# Patient Record
Sex: Female | Born: 1960 | Race: White | Hispanic: No | Marital: Single | Smoking: Current some day smoker
Health system: Southern US, Community
[De-identification: ages and names within clinical notes are randomized; demographics above are authoritative.]

## PROBLEM LIST (undated history)

## (undated) DIAGNOSIS — J45909 Unspecified asthma, uncomplicated: Secondary | ICD-10-CM

## (undated) DIAGNOSIS — R011 Cardiac murmur, unspecified: Secondary | ICD-10-CM

## (undated) DIAGNOSIS — M1990 Unspecified osteoarthritis, unspecified site: Secondary | ICD-10-CM

## (undated) DIAGNOSIS — D509 Iron deficiency anemia, unspecified: Secondary | ICD-10-CM

## (undated) DIAGNOSIS — J189 Pneumonia, unspecified organism: Secondary | ICD-10-CM

## (undated) DIAGNOSIS — R51 Headache: Secondary | ICD-10-CM

## (undated) DIAGNOSIS — E079 Disorder of thyroid, unspecified: Secondary | ICD-10-CM

## (undated) DIAGNOSIS — E7800 Pure hypercholesterolemia, unspecified: Secondary | ICD-10-CM

## (undated) DIAGNOSIS — E119 Type 2 diabetes mellitus without complications: Secondary | ICD-10-CM

## (undated) DIAGNOSIS — C50919 Malignant neoplasm of unspecified site of unspecified female breast: Secondary | ICD-10-CM

## (undated) DIAGNOSIS — R519 Headache, unspecified: Secondary | ICD-10-CM

## (undated) DIAGNOSIS — K922 Gastrointestinal hemorrhage, unspecified: Secondary | ICD-10-CM

## (undated) DIAGNOSIS — E039 Hypothyroidism, unspecified: Secondary | ICD-10-CM

## (undated) HISTORY — DX: Pure hypercholesterolemia, unspecified: E78.00

## (undated) HISTORY — PX: UPPER GI ENDOSCOPY: SHX6162

## (undated) HISTORY — DX: Disorder of thyroid, unspecified: E07.9

## (undated) HISTORY — DX: Malignant neoplasm of unspecified site of unspecified female breast: C50.919

## (undated) HISTORY — DX: Type 2 diabetes mellitus without complications: E11.9

## (undated) HISTORY — DX: Unspecified osteoarthritis, unspecified site: M19.90

## (undated) HISTORY — PX: CARPAL TUNNEL RELEASE: SHX101

## (undated) HISTORY — PX: OOPHORECTOMY: SHX86

## (undated) HISTORY — DX: Iron deficiency anemia, unspecified: D50.9

## (undated) HISTORY — DX: Gastrointestinal hemorrhage, unspecified: K92.2

## (undated) HISTORY — PX: OTHER SURGICAL HISTORY: SHX169

## (undated) HISTORY — DX: Cardiac murmur, unspecified: R01.1

---

## 1981-10-18 HISTORY — PX: HEMORRHOID SURGERY: SHX153

## 1982-10-18 HISTORY — PX: TONSILLECTOMY: SUR1361

## 1989-10-18 DIAGNOSIS — E079 Disorder of thyroid, unspecified: Secondary | ICD-10-CM

## 1989-10-18 HISTORY — DX: Disorder of thyroid, unspecified: E07.9

## 1994-10-18 DIAGNOSIS — C50919 Malignant neoplasm of unspecified site of unspecified female breast: Secondary | ICD-10-CM

## 1994-10-18 HISTORY — PX: MASTECTOMY: SHX3

## 1994-10-18 HISTORY — DX: Malignant neoplasm of unspecified site of unspecified female breast: C50.919

## 1995-10-19 HISTORY — PX: ABDOMINAL HYSTERECTOMY: SHX81

## 2001-10-18 HISTORY — PX: BREAST BIOPSY: SHX20

## 2004-09-20 ENCOUNTER — Emergency Department: Payer: Self-pay | Admitting: Unknown Physician Specialty

## 2004-10-18 HISTORY — PX: COLONOSCOPY: SHX174

## 2005-04-02 ENCOUNTER — Ambulatory Visit: Payer: Self-pay | Admitting: General Surgery

## 2005-07-23 ENCOUNTER — Ambulatory Visit: Payer: Self-pay | Admitting: General Surgery

## 2005-10-27 ENCOUNTER — Emergency Department: Payer: Self-pay | Admitting: Emergency Medicine

## 2006-05-25 ENCOUNTER — Ambulatory Visit: Payer: Self-pay | Admitting: General Surgery

## 2006-06-15 ENCOUNTER — Ambulatory Visit: Payer: Self-pay | Admitting: General Surgery

## 2006-09-27 ENCOUNTER — Ambulatory Visit: Payer: Self-pay | Admitting: Specialist

## 2006-12-14 ENCOUNTER — Emergency Department: Payer: Self-pay | Admitting: Emergency Medicine

## 2007-07-05 ENCOUNTER — Ambulatory Visit: Payer: Self-pay | Admitting: General Surgery

## 2008-05-10 ENCOUNTER — Emergency Department: Payer: Self-pay | Admitting: Emergency Medicine

## 2008-05-20 ENCOUNTER — Ambulatory Visit: Payer: Self-pay | Admitting: Specialist

## 2008-07-29 ENCOUNTER — Ambulatory Visit: Payer: Self-pay | Admitting: General Surgery

## 2008-07-29 ENCOUNTER — Ambulatory Visit: Payer: Self-pay | Admitting: Specialist

## 2008-10-18 HISTORY — PX: GASTRIC BYPASS: SHX52

## 2008-10-24 ENCOUNTER — Ambulatory Visit: Payer: Self-pay | Admitting: Specialist

## 2008-10-24 ENCOUNTER — Ambulatory Visit: Payer: Self-pay | Admitting: Endocrinology

## 2008-11-18 ENCOUNTER — Ambulatory Visit: Payer: Self-pay | Admitting: Endocrinology

## 2009-04-05 ENCOUNTER — Inpatient Hospital Stay: Payer: Self-pay | Admitting: Internal Medicine

## 2009-07-30 ENCOUNTER — Ambulatory Visit: Payer: Self-pay | Admitting: General Surgery

## 2009-09-17 ENCOUNTER — Ambulatory Visit: Payer: Self-pay | Admitting: Internal Medicine

## 2009-10-09 ENCOUNTER — Ambulatory Visit: Payer: Self-pay | Admitting: Internal Medicine

## 2009-10-18 ENCOUNTER — Ambulatory Visit: Payer: Self-pay | Admitting: Internal Medicine

## 2009-10-18 HISTORY — PX: KNEE SURGERY: SHX244

## 2009-11-18 ENCOUNTER — Ambulatory Visit: Payer: Self-pay | Admitting: Internal Medicine

## 2009-12-15 ENCOUNTER — Ambulatory Visit: Payer: Self-pay | Admitting: Internal Medicine

## 2009-12-16 ENCOUNTER — Ambulatory Visit: Payer: Self-pay | Admitting: Internal Medicine

## 2010-01-16 ENCOUNTER — Ambulatory Visit: Payer: Self-pay | Admitting: Internal Medicine

## 2010-02-10 ENCOUNTER — Emergency Department: Payer: Self-pay | Admitting: Emergency Medicine

## 2010-02-13 ENCOUNTER — Ambulatory Visit: Payer: Self-pay | Admitting: Orthopedic Surgery

## 2010-02-16 ENCOUNTER — Ambulatory Visit: Payer: Self-pay | Admitting: Orthopedic Surgery

## 2010-03-18 ENCOUNTER — Ambulatory Visit: Payer: Self-pay | Admitting: Internal Medicine

## 2010-03-31 ENCOUNTER — Ambulatory Visit: Payer: Self-pay | Admitting: Internal Medicine

## 2010-04-17 ENCOUNTER — Ambulatory Visit: Payer: Self-pay | Admitting: Internal Medicine

## 2010-08-03 ENCOUNTER — Ambulatory Visit: Payer: Self-pay | Admitting: General Surgery

## 2010-10-08 ENCOUNTER — Ambulatory Visit: Payer: Self-pay | Admitting: Specialist

## 2010-12-02 ENCOUNTER — Ambulatory Visit: Payer: Self-pay | Admitting: Internal Medicine

## 2010-12-17 ENCOUNTER — Ambulatory Visit: Payer: Self-pay | Admitting: Internal Medicine

## 2011-04-12 ENCOUNTER — Ambulatory Visit: Payer: Self-pay | Admitting: Internal Medicine

## 2011-04-18 ENCOUNTER — Ambulatory Visit: Payer: Self-pay | Admitting: Internal Medicine

## 2011-04-28 ENCOUNTER — Ambulatory Visit: Payer: Self-pay | Admitting: General Surgery

## 2011-09-08 ENCOUNTER — Ambulatory Visit: Payer: Self-pay | Admitting: General Surgery

## 2012-10-18 DIAGNOSIS — K922 Gastrointestinal hemorrhage, unspecified: Secondary | ICD-10-CM

## 2012-10-18 HISTORY — DX: Gastrointestinal hemorrhage, unspecified: K92.2

## 2012-11-15 ENCOUNTER — Inpatient Hospital Stay: Payer: Self-pay | Admitting: Student

## 2012-11-15 LAB — CBC WITH DIFFERENTIAL/PLATELET
Eosinophil #: 0.4 10*3/uL (ref 0.0–0.7)
HCT: 39.7 % (ref 35.0–47.0)
MCV: 82 fL (ref 80–100)
Monocyte #: 1 x10 3/mm — ABNORMAL HIGH (ref 0.2–0.9)
Neutrophil %: 65.8 %
Platelet: 439 10*3/uL (ref 150–440)
RDW: 14.3 % (ref 11.5–14.5)

## 2012-11-15 LAB — COMPREHENSIVE METABOLIC PANEL
Albumin: 3.8 g/dL (ref 3.4–5.0)
Anion Gap: 12 (ref 7–16)
BUN: 23 mg/dL — ABNORMAL HIGH (ref 7–18)
Chloride: 109 mmol/L — ABNORMAL HIGH (ref 98–107)
Co2: 21 mmol/L (ref 21–32)
Osmolality: 296 (ref 275–301)
Potassium: 4.1 mmol/L (ref 3.5–5.1)
SGOT(AST): 16 U/L (ref 15–37)
SGPT (ALT): 19 U/L (ref 12–78)
Sodium: 142 mmol/L (ref 136–145)

## 2012-11-15 LAB — PROTIME-INR: INR: 1

## 2012-11-15 LAB — HEMOGLOBIN
HGB: 9.1 g/dL — ABNORMAL LOW (ref 12.0–16.0)
HGB: 7.6 g/dL — ABNORMAL LOW (ref 12.0–16.0)

## 2012-11-15 LAB — APTT: Activated PTT: 36.1 secs — ABNORMAL HIGH (ref 23.6–35.9)

## 2012-11-16 LAB — CBC WITH DIFFERENTIAL/PLATELET
Basophil #: 0.1 10*3/uL (ref 0.0–0.1)
Basophil %: 1.5 %
Eosinophil %: 3.3 %
HCT: 23.2 % — ABNORMAL LOW (ref 35.0–47.0)
HGB: 7.6 g/dL — ABNORMAL LOW (ref 12.0–16.0)
Lymphocyte %: 32.8 %
MCHC: 32.8 g/dL (ref 32.0–36.0)
Monocyte #: 0.3 x10 3/mm (ref 0.2–0.9)
Neutrophil #: 3.5 10*3/uL (ref 1.4–6.5)
Neutrophil %: 57.5 %
Platelet: 273 10*3/uL (ref 150–440)
RBC: 2.82 10*6/uL — ABNORMAL LOW (ref 3.80–5.20)
RDW: 14 % (ref 11.5–14.5)

## 2012-11-16 LAB — IRON AND TIBC
Iron Bind.Cap.(Total): 332 ug/dL (ref 250–450)
Unbound Iron-Bind.Cap.: 282 ug/dL

## 2012-11-16 LAB — HEMOGLOBIN: HGB: 7.5 g/dL — ABNORMAL LOW (ref 12.0–16.0)

## 2012-11-17 LAB — CBC WITH DIFFERENTIAL/PLATELET
Basophil #: 0.1 10*3/uL (ref 0.0–0.1)
Basophil %: 2.1 %
Eosinophil #: 0.2 10*3/uL (ref 0.0–0.7)
Eosinophil %: 4.6 %
HCT: 22.6 % — ABNORMAL LOW (ref 35.0–47.0)
HGB: 7.4 g/dL — ABNORMAL LOW (ref 12.0–16.0)
Lymphocyte #: 2.2 10*3/uL (ref 1.0–3.6)
MCH: 26.6 pg (ref 26.0–34.0)
MCV: 81 fL (ref 80–100)
Monocyte %: 5.8 %
Neutrophil #: 2.5 10*3/uL (ref 1.4–6.5)
Platelet: 268 10*3/uL (ref 150–440)
RBC: 2.78 10*6/uL — ABNORMAL LOW (ref 3.80–5.20)
RDW: 14 % (ref 11.5–14.5)
WBC: 5.4 10*3/uL (ref 3.6–11.0)

## 2012-11-17 LAB — HEMOGLOBIN: HGB: 8.4 g/dL — ABNORMAL LOW (ref 12.0–16.0)

## 2012-11-18 LAB — HEMOGLOBIN: HGB: 8.5 g/dL — ABNORMAL LOW (ref 12.0–16.0)

## 2012-11-20 ENCOUNTER — Inpatient Hospital Stay: Payer: Self-pay | Admitting: Internal Medicine

## 2012-11-20 LAB — COMPREHENSIVE METABOLIC PANEL
Alkaline Phosphatase: 82 U/L (ref 50–136)
Anion Gap: 8 (ref 7–16)
BUN: 6 mg/dL — ABNORMAL LOW (ref 7–18)
Bilirubin,Total: 0.4 mg/dL (ref 0.2–1.0)
Calcium, Total: 9 mg/dL (ref 8.5–10.1)
Creatinine: 0.66 mg/dL (ref 0.60–1.30)
EGFR (Non-African Amer.): >60
Osmolality: 285 (ref 275–301)
Potassium: 3.5 mmol/L (ref 3.5–5.1)
SGOT(AST): 18 U/L (ref 15–37)
SGPT (ALT): 19 U/L (ref 12–78)
Sodium: 144 mmol/L (ref 136–145)

## 2012-11-20 LAB — CBC
HCT: 30.4 % — ABNORMAL LOW (ref 35.0–47.0)
MCH: 27.8 pg (ref 26.0–34.0)
MCV: 82 fL (ref 80–100)
Platelet: 439 10*3/uL (ref 150–440)
RDW: 14.2 % (ref 11.5–14.5)
WBC: 7.5 10*3/uL (ref 3.6–11.0)

## 2012-11-20 LAB — LIPASE, BLOOD: Lipase: 182 U/L (ref 73–393)

## 2012-11-20 LAB — PROTIME-INR
INR: 0.8
Prothrombin Time: 11.4 secs — ABNORMAL LOW (ref 11.5–14.7)

## 2012-11-20 LAB — HEMOGLOBIN: HGB: 9.4 g/dL — ABNORMAL LOW (ref 12.0–16.0)

## 2012-11-21 LAB — BASIC METABOLIC PANEL
Anion Gap: 5 — ABNORMAL LOW (ref 7–16)
Chloride: 114 mmol/L — ABNORMAL HIGH (ref 98–107)
Co2: 27 mmol/L (ref 21–32)
Glucose: 95 mg/dL (ref 65–99)

## 2012-11-21 LAB — CBC WITH DIFFERENTIAL/PLATELET
Basophil #: 0.1 10*3/uL (ref 0.0–0.1)
Eosinophil #: 0.2 10*3/uL (ref 0.0–0.7)
Eosinophil %: 3.9 %
Lymphocyte #: 1.7 10*3/uL (ref 1.0–3.6)
Lymphocyte %: 32.3 %
MCHC: 33.3 g/dL (ref 32.0–36.0)
Monocyte #: 0.3 x10 3/mm (ref 0.2–0.9)
Monocyte %: 6.5 %
Neutrophil %: 55.3 %
RBC: 2.93 10*6/uL — ABNORMAL LOW (ref 3.80–5.20)

## 2012-11-22 LAB — CBC WITH DIFFERENTIAL/PLATELET
Lymphocyte #: 1.4 10*3/uL (ref 1.0–3.6)
MCH: 27.2 pg (ref 26.0–34.0)
MCHC: 32.6 g/dL (ref 32.0–36.0)
Monocyte #: 0.4 x10 3/mm (ref 0.2–0.9)
Neutrophil %: 59 %
Platelet: 356 10*3/uL (ref 150–440)
RBC: 2.93 10*6/uL — ABNORMAL LOW (ref 3.80–5.20)
RDW: 14.8 % — ABNORMAL HIGH (ref 11.5–14.5)

## 2012-11-23 LAB — CBC WITH DIFFERENTIAL/PLATELET
Basophil #: 0.1 10*3/uL (ref 0.0–0.1)
Basophil %: 1.9 %
Eosinophil #: 0.2 10*3/uL (ref 0.0–0.7)
HCT: 24.2 % — ABNORMAL LOW (ref 35.0–47.0)
HGB: 7.9 g/dL — ABNORMAL LOW (ref 12.0–16.0)
Lymphocyte #: 1.6 10*3/uL (ref 1.0–3.6)
Lymphocyte %: 30 %
MCH: 27.2 pg (ref 26.0–34.0)
Monocyte %: 7.8 %
Neutrophil #: 3.1 10*3/uL (ref 1.4–6.5)
Neutrophil %: 56.1 %
Platelet: 355 10*3/uL (ref 150–440)
RBC: 2.92 10*6/uL — ABNORMAL LOW (ref 3.80–5.20)
RDW: 14.6 % — ABNORMAL HIGH (ref 11.5–14.5)

## 2012-12-18 ENCOUNTER — Ambulatory Visit: Payer: Self-pay | Admitting: General Surgery

## 2012-12-26 ENCOUNTER — Encounter: Payer: Self-pay | Admitting: *Deleted

## 2013-01-08 ENCOUNTER — Encounter: Payer: Self-pay | Admitting: General Surgery

## 2013-01-08 ENCOUNTER — Ambulatory Visit (INDEPENDENT_AMBULATORY_CARE_PROVIDER_SITE_OTHER): Payer: 59 | Admitting: General Surgery

## 2013-01-08 VITALS — BP 124/70 | HR 82 | Resp 14 | Ht 68.0 in | Wt 192.0 lb

## 2013-01-08 DIAGNOSIS — D0592 Unspecified type of carcinoma in situ of left breast: Secondary | ICD-10-CM

## 2013-01-08 DIAGNOSIS — Z853 Personal history of malignant neoplasm of breast: Secondary | ICD-10-CM

## 2013-01-08 DIAGNOSIS — D0590 Unspecified type of carcinoma in situ of unspecified breast: Secondary | ICD-10-CM

## 2013-01-08 NOTE — Progress Notes (Signed)
Patient ID: Erin Tate, female   DOB: [birth year], 52 y.o.   MRN: [id number]  Chief Complaint  Patient presents with  . Breast Cancer Long Term Follow Up    HPI  HPI  This is a 52 year old female following up from her mammogram done @ [facility] 12/18/12 cat 1. Patient had a left mastectomy in 1996.Patient states no new breast problems.    Past Surgical History  Procedure Laterality Date  . Carpal tunnel release Bilateral 2007,2011  . Gastric bypass  2010  . Upper gi endoscopy  2010,  . Hemorrhoid surgery  1983  . Tonsillectomy  1984  . 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 Abdominal hysterectomy  1997  . Colonoscopy  2006    Dr. Lemar Livings  . Knee surgery Right 2011    No family history on file.  Social History History  Substance Use Topics  . Smoking status: Former Smoker -- 1.00 packs/day for 15 years  . Smokeless tobacco: Never Used  . Alcohol Use: No    Allergies no known allergies  Current Outpatient Prescriptions  Medication Sig Dispense Refill  . Canagliflozin (INVOKANA) 300 MG TABS Take 300 mg by mouth daily.      . DULoxetine (CYMBALTA) 60 MG capsule Take 60 mg by mouth daily.      . ergocalciferol (VITAMIN D2) 50000 UNITS capsule Take 50,000 Units by mouth once a week.      . levothyroxine (SYNTHROID, LEVOTHROID) 150 MCG tablet Take 150 mcg by mouth daily.      . Liraglutide (VICTOZA) 18 MG/3ML SOLN injection Inject into the skin daily.      . metFORMIN (GLUMETZA) 500 MG (MOD) 24 hr tablet Take 500 mg by mouth daily with breakfast.      . Multiple Vitamin (MULTIVITAMIN) tablet Take 1 tablet by mouth daily.      . rosuvastatin (CRESTOR) 20 MG tablet Take 20 mg by mouth daily.       No current facility-administered medications for this visit.    Review of Systems Review of Systems  Constitutional: Negative.   Respiratory: Negative.   Cardiovascular: Negative.     Blood pressure 124/70, pulse 82, resp. rate 14, height 5\' 8"  (1.727 m), weight 192 lb (87.091  kg).  Physical Exam Physical Exam  Constitutional: She appears well-nourished.  Neck: Normal range of motion.  Cardiovascular: Normal rate, regular rhythm and normal heart sounds.   Pulmonary/Chest: Breath sounds normal. Right breast exhibits no inverted nipple, no mass, no nipple discharge, no skin change and no tenderness.  Left mastectomy site well healed. Right breast keratosis. Marland Kitchen  1 cm seborrheic keratosis on the right areola at the 1 o'clock position, non-inflammed.   Data Reviewed December 18, 2012 right mammogram: BIRAD 1.   Assessment    Doing well without evidence of recurrent or contralateral disease.  Weight loss secondary to recent upper GI bleed secondary to small bowel ulcer s/p gastric bypass.     Plan    Repeat right diagnostic mammogram in one year. Reviewed option for genetic testing for BRCA as she was 34 at diagnosis. (Now s/p BSO, but she does have a son).        Erin Tate 01/08/2013, 10:05 AM

## 2013-01-10 ENCOUNTER — Encounter: Payer: Self-pay | Admitting: General Surgery

## 2013-04-17 HISTORY — PX: NECK SURGERY: SHX720

## 2013-05-21 ENCOUNTER — Ambulatory Visit: Payer: Self-pay | Admitting: Internal Medicine

## 2013-06-06 ENCOUNTER — Ambulatory Visit: Payer: Self-pay | Admitting: Internal Medicine

## 2013-06-06 LAB — FERRITIN: Ferritin (ARMC): 3 ng/mL — ABNORMAL LOW (ref 8–388)

## 2013-06-18 ENCOUNTER — Ambulatory Visit: Payer: Self-pay | Admitting: Internal Medicine

## 2013-06-27 ENCOUNTER — Emergency Department: Payer: Self-pay | Admitting: Emergency Medicine

## 2013-07-18 ENCOUNTER — Ambulatory Visit: Payer: Self-pay | Admitting: Internal Medicine

## 2013-08-22 IMAGING — MG MM MAMMO DIAGNOSTIC UNILATERAL*R*
1 series · 3 of 3 positions shown · non-contrast
Comparison: none

REASON FOR EXAM: HX BRST CA LT MAST
COMMENTS:

PROCEDURE:     MAM - MAM DGTL UNI MAM RT BREAST W/CAD  - December 18, 2012  [DATE]
RESULT:     COMPARISON:  08/03/2010, 07/23/2003
TECHNIQUE: Digital unilateral right mammograms were obtained. FDA approved
computer-aided detection (CAD) for mammography was utilized for this study.
BREAST COMPOSITION: The breast composition is SCATTERED FIBROGLANDULAR
TISSUE (glandular tissue is  25-50%)

[R CC · right · 3 of 3 slices shown]
[im 1/3]
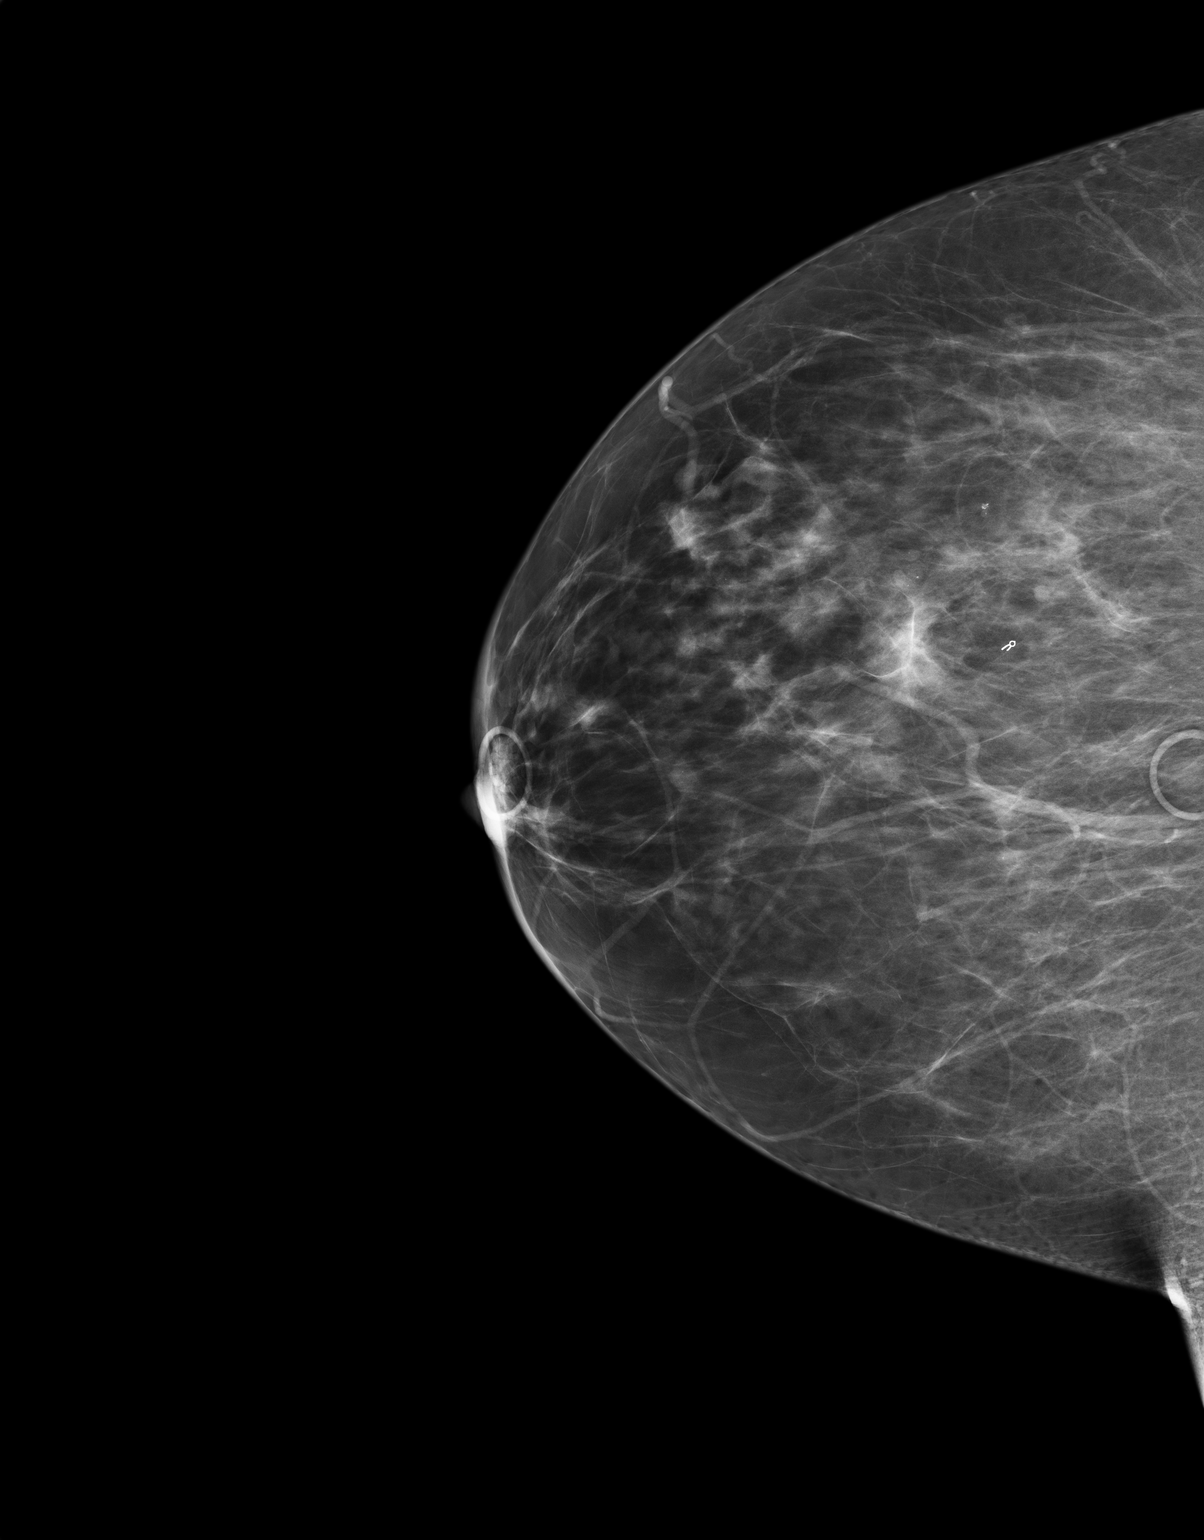
[im 2/3]
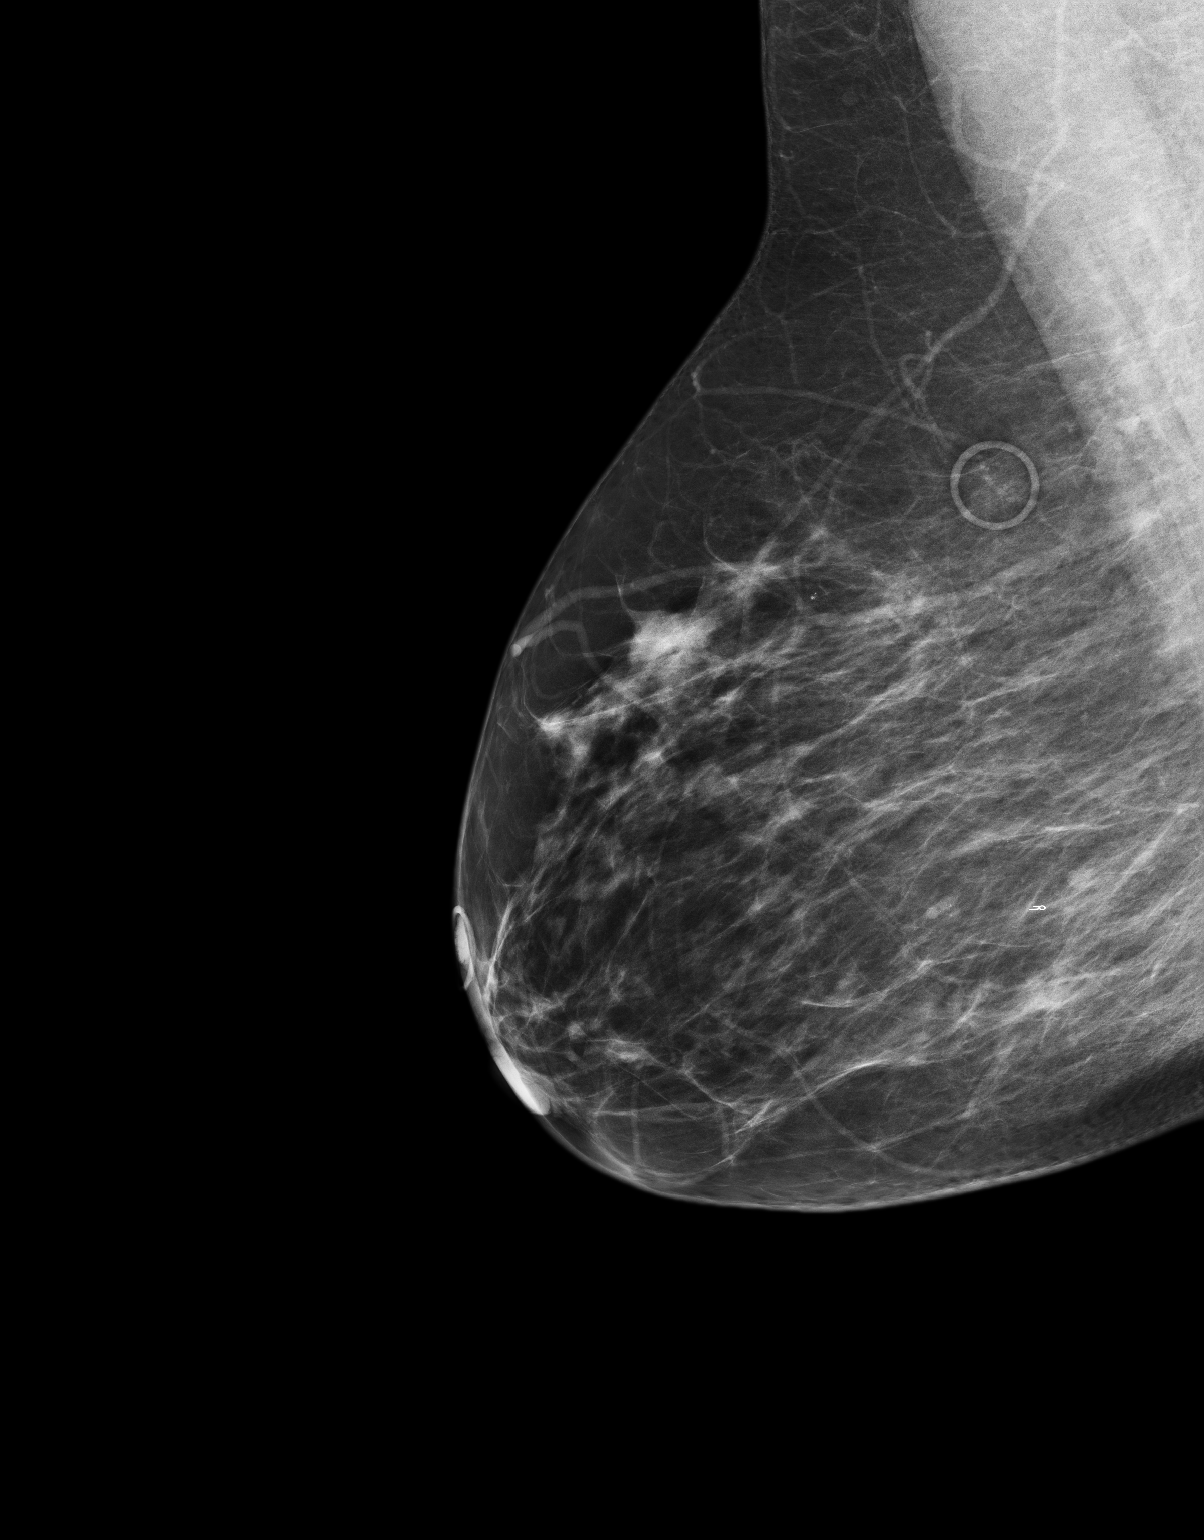
[im 3/3]
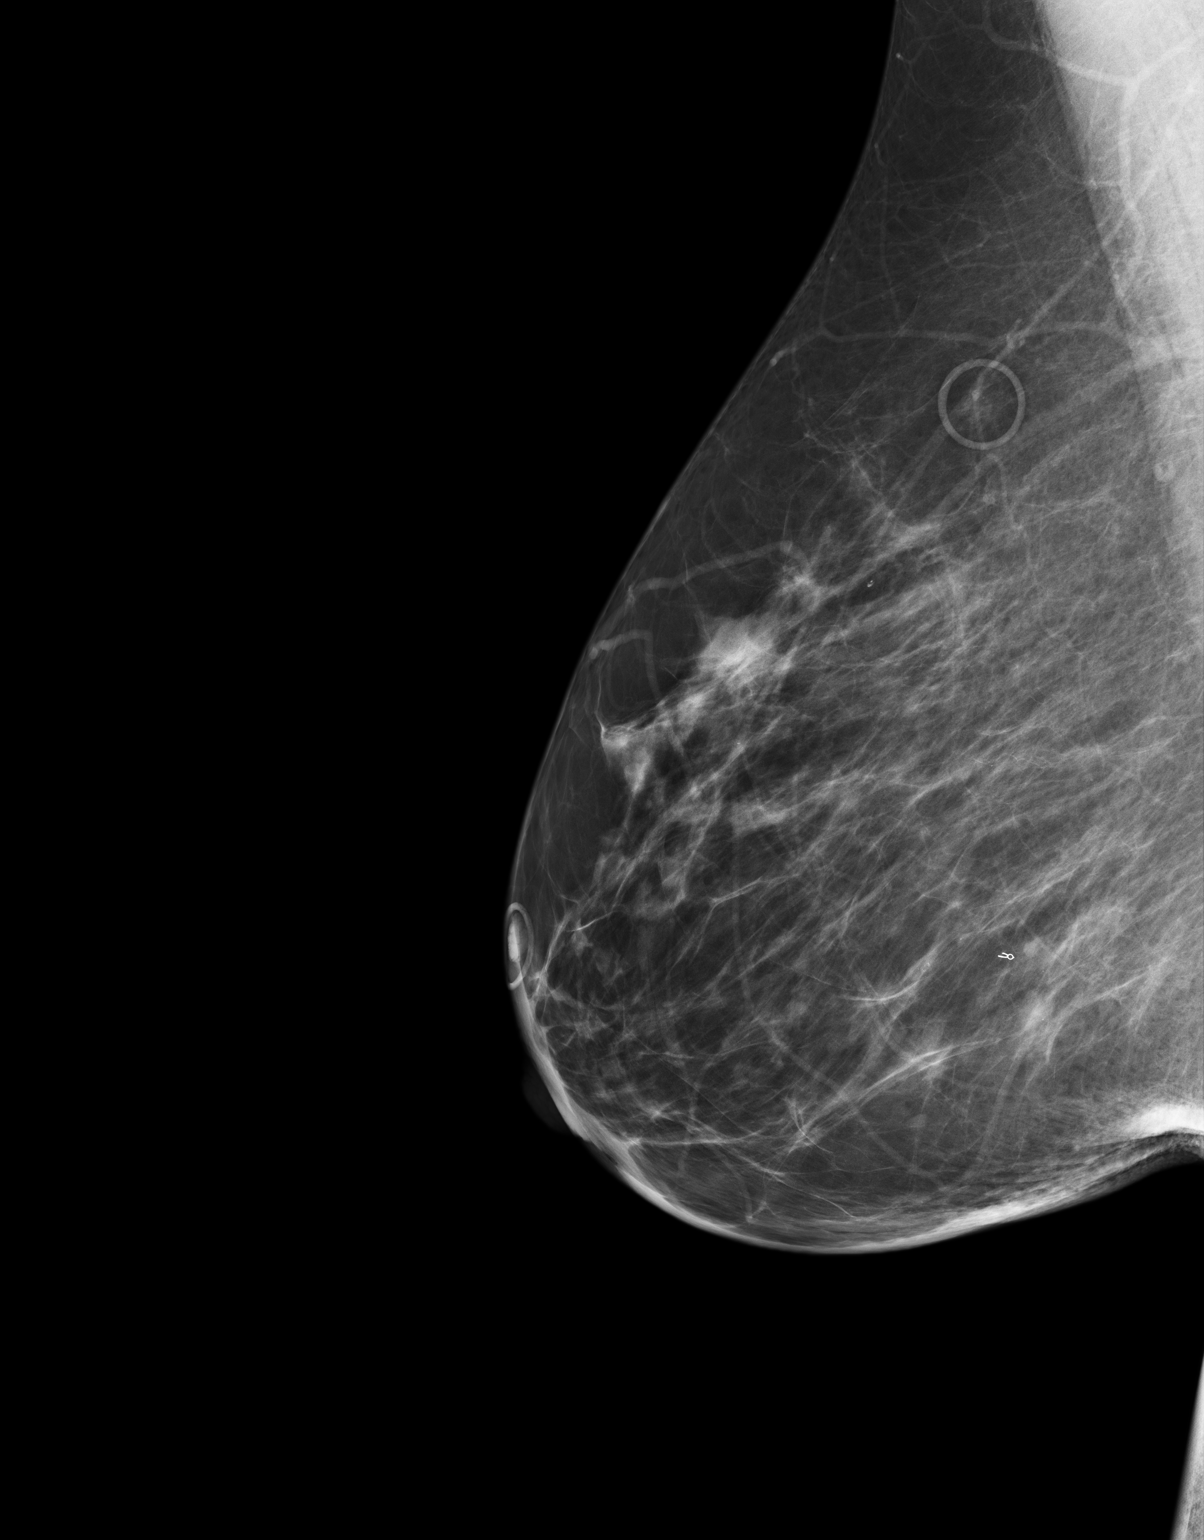

[3 of 3 positions shown; findings below may reference images not displayed]

FINDING: There is no dominant mass, architectural distortion or clusters of
suspicious microcalcifications.
IMPRESSION: 1.     Stable right breast mammogram.
2.     Annual mammographic follow up recommended.

BI-RADS:  Category 1- Negative

A negative mammogram report does not preclude biopsy or other evaluation of
a clinically palpable or otherwise suspicious mass or lesion. Breast cancer
may not be detected by mammography in up to 10% of cases.

[REDACTED]

## 2013-11-07 ENCOUNTER — Encounter: Payer: Self-pay | Admitting: General Surgery

## 2013-12-18 ENCOUNTER — Ambulatory Visit: Payer: Self-pay | Admitting: General Surgery

## 2013-12-19 ENCOUNTER — Encounter: Payer: Self-pay | Admitting: General Surgery

## 2014-01-09 ENCOUNTER — Encounter: Payer: Self-pay | Admitting: General Surgery

## 2014-01-09 ENCOUNTER — Ambulatory Visit (INDEPENDENT_AMBULATORY_CARE_PROVIDER_SITE_OTHER): Payer: 59 | Admitting: General Surgery

## 2014-01-09 VITALS — BP 140/78 | HR 82 | Resp 12 | Ht 68.0 in | Wt 178.0 lb

## 2014-01-09 DIAGNOSIS — Z853 Personal history of malignant neoplasm of breast: Secondary | ICD-10-CM

## 2014-01-09 NOTE — Progress Notes (Signed)
Patient ID: Erin Tate, female   DOB: Feb 18, 1961, 53 y.o.   MRN: [id number]  Chief Complaint  Patient presents with  . Follow-up    mammogram    HPI Erin Tate is a 53 y.o. female who presents for a breast evaluation. The most recent right breast  mammogram was done on 3/315.Patient does perform regular self breast checks and gets regular mammograms done. Denise any new breast problems.   HPI  Past Medical History  Diagnosis Date  . Heart murmur   . Thyroid condition 1991  . Diabetes mellitus without complication   . High cholesterol   . Upper GI bleed 2014  . 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 Carpal tunnel release Bilateral 2007,2011  . Gastric bypass  2010  . Upper gi endoscopy  2010,  . Hemorrhoid surgery  1983  . Tonsillectomy  1984  . 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 Abdominal hysterectomy  1997  . Colonoscopy  2006    Dr. Bary Castilla  . Knee surgery Right 2011  . Neck surgery  04/2013    No family history on file.  Social History History  Substance Use Topics  . Smoking status: Former Smoker -- 1.00 packs/day for 15 years  . Smokeless tobacco: Never Used  . Alcohol Use: No    No Known Allergies  Current Outpatient Prescriptions  Medication Sig Dispense Refill  . Canagliflozin (INVOKANA) 300 MG TABS Take 300 mg by mouth daily.      . DULoxetine (CYMBALTA) 60 MG capsule Take 60 mg by mouth daily.      . ergocalciferol (VITAMIN D2) 50000 UNITS capsule Take 50,000 Units by mouth once a week.      . levothyroxine (SYNTHROID, LEVOTHROID) 150 MCG tablet Take 150 mcg by mouth daily.      . metFORMIN (GLUMETZA) 500 MG (MOD) 24 hr tablet Take 500 mg by mouth daily with breakfast.      . Multiple Vitamin (MULTIVITAMIN) tablet Take 1 tablet by mouth daily.      . rosuvastatin (CRESTOR) 20 MG tablet Take 20 mg by mouth daily.       No current facility-administered medications for this visit.     Review of Systems Review of Systems  Constitutional: Negative.   Respiratory: Negative.   Cardiovascular: Negative.     Blood pressure 140/78, pulse 82, resp. rate 12, height 5\' 8"  (1.727 m), weight 178 lb (80.74 kg).  Physical Exam Physical Exam  Constitutional: She is oriented to person, place, and time. She appears well-developed and well-nourished.  Eyes: Conjunctivae are normal.  Neck: Neck supple.  Cardiovascular: Normal rate and regular rhythm.   Murmur heard.  Systolic murmur is present with a grade of 1/6  Pulmonary/Chest: Breath sounds normal. Right breast exhibits no inverted nipple, no mass, no nipple discharge, no skin change and no tenderness.  Left chest wall well healed scar  Neurological: She is alert and oriented to person, place, and time.  Skin: Skin is warm and dry.    Data Reviewed Right breast mammogram dated December 18, 2013 was reviewed. BI-RAD-1.  Assessment    Doing well no 19 years status post left mastectomy for DCIS. Benign right breast exam.  Upper GI bleeding secondary to marginal ulcer (by report) 2014. No recurrent bleeding.  Candidate for screening colonoscopy in 2016.     Plan    We'll plan for a followup examination  and right mammogram in one year.  The patient did request of upper endoscopy to be completed at the time of her colonoscopy to assess for new marginal ulcers.       PCP: Genevie Ann 01/09/2014, 9:01 PM

## 2014-01-09 NOTE — Patient Instructions (Addendum)
Patient to return in one year right diagnotic mammogram. Continue self breast exams. Call office for any new breast issues or concerns.

## 2014-03-01 IMAGING — CR DG CHEST 2V
1 series · 2 of 2 positions shown · non-contrast
Comparison: none

REASON FOR EXAM: cough and congestion
COMMENTS:

[Series 1: w chest pa · 0.14mm/px · 2 of 2 slices shown]
[im 1/2]
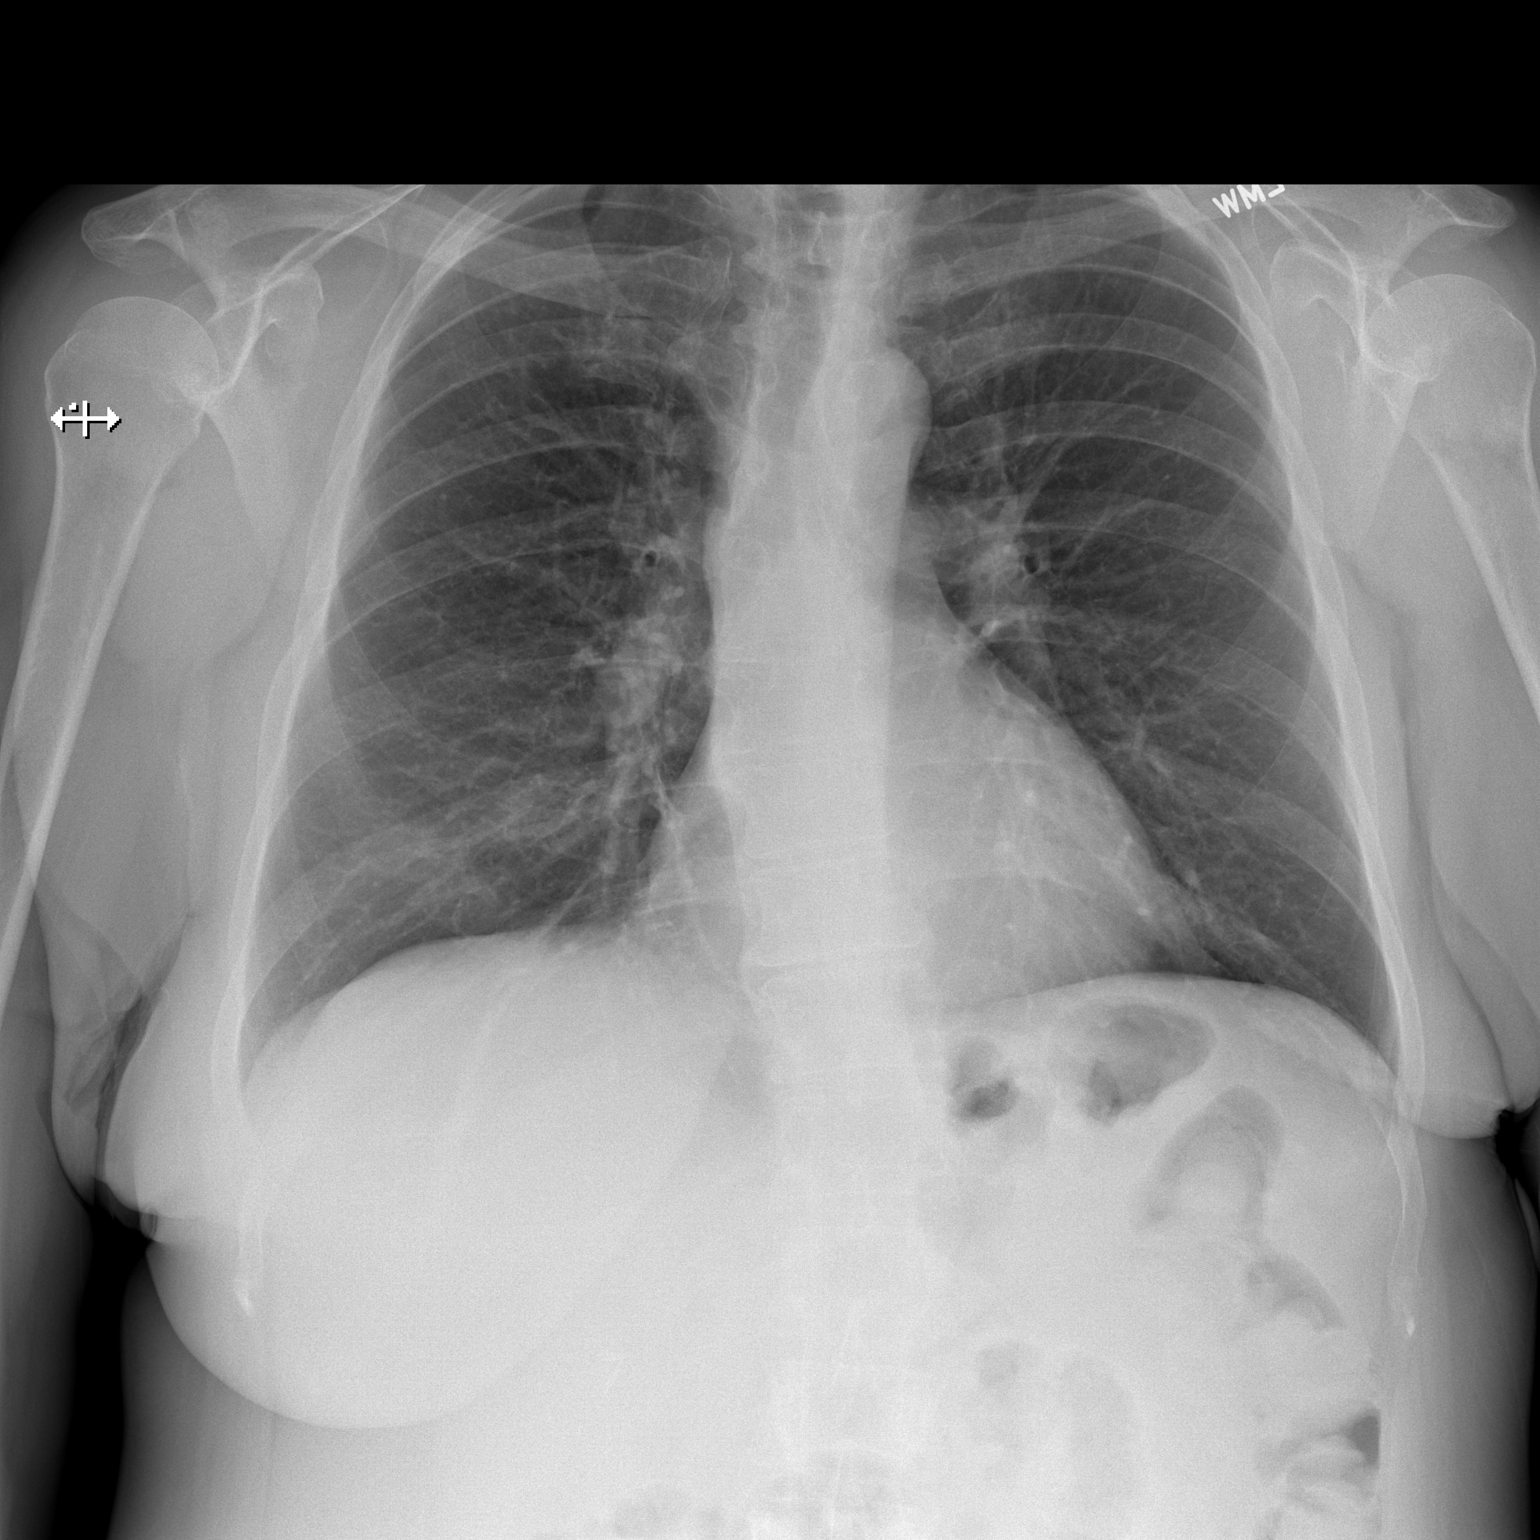
[im 2/2]
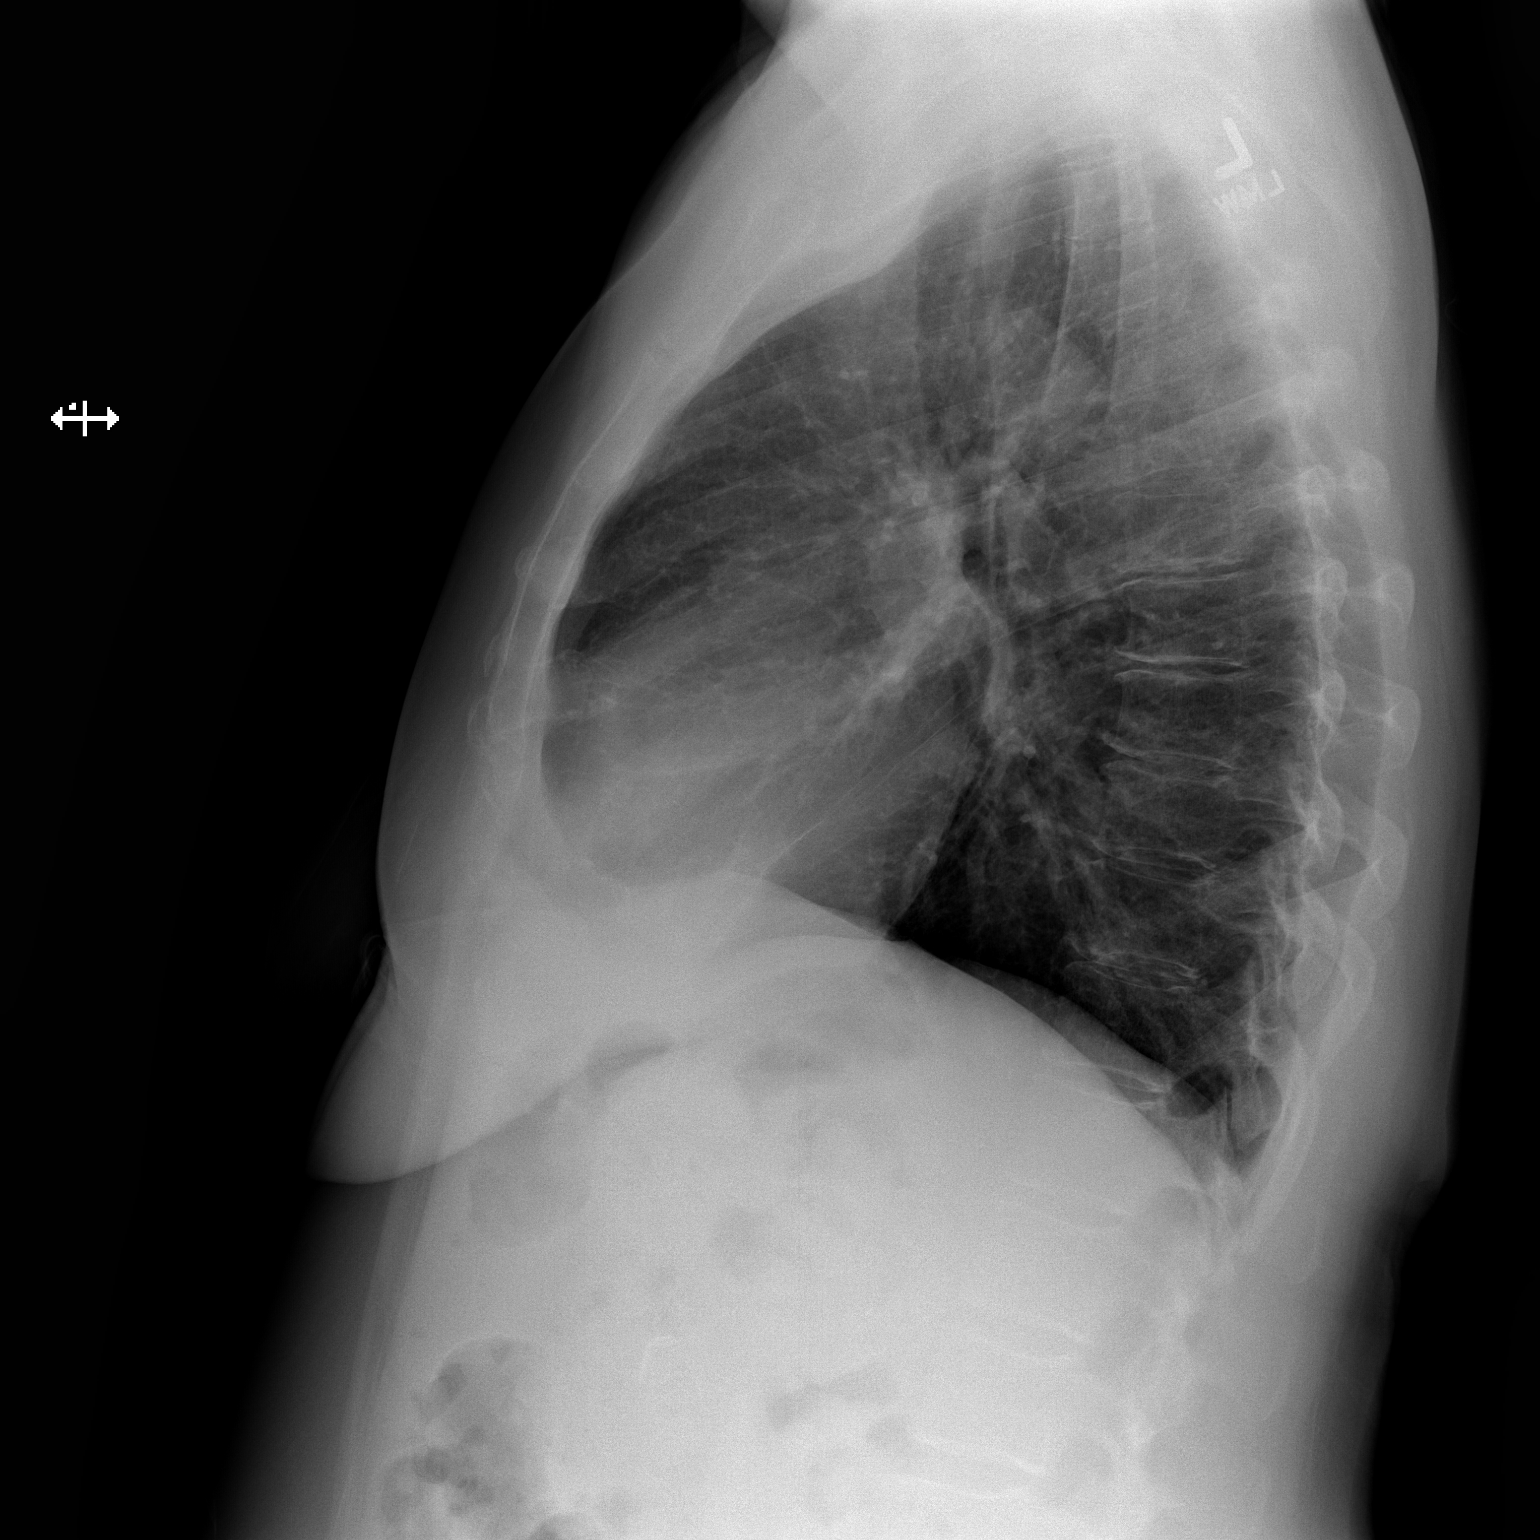

[2 of 2 positions shown; findings below may reference images not displayed]

PROCEDURE:     DXR - DXR CHEST PA (OR AP) AND LATERAL  - June 27, 2013  [DATE]

RESULT:     The lungs are adequately inflated. Mildly increased interstitial
markings are noted in the lower lung zones bilaterally especially
anteriorly. There is no alveolar infiltrate. There is no pleural effusion or
pneumothorax. The cardiac silhouette is normal in size. The pulmonary
vascularity is not engorged. There is gentle curvature of the mid thoracic
spine with the convexity toward the right. The bony thorax exhibits no acute
abnormality.
IMPRESSION: Minimally increased interstitial markings in the lower lung
zones likely reflects subsegmental atelectasis. Underlying hyperinflation
suggests reactive airway disease or COPD. Followup films following therapy
are recommended if the patient's symptoms persi[REDACTED]

## 2014-08-19 ENCOUNTER — Encounter: Payer: Self-pay | Admitting: General Surgery

## 2014-08-22 ENCOUNTER — Emergency Department: Payer: Self-pay | Admitting: Student

## 2014-08-22 LAB — COMPREHENSIVE METABOLIC PANEL
Albumin: 3.8 g/dL (ref 3.4–5.0)
Alkaline Phosphatase: 93 U/L
Anion Gap: 9 (ref 7–16)
BILIRUBIN TOTAL: 0.4 mg/dL (ref 0.2–1.0)
BUN: 17 mg/dL (ref 7–18)
CALCIUM: 8.7 mg/dL (ref 8.5–10.1)
CHLORIDE: 106 mmol/L (ref 98–107)
Co2: 27 mmol/L (ref 21–32)
Creatinine: 0.58 mg/dL — ABNORMAL LOW (ref 0.60–1.30)
EGFR (African American): >60
Glucose: 90 mg/dL (ref 65–99)
Osmolality: 284 (ref 275–301)
Potassium: 3.6 mmol/L (ref 3.5–5.1)
SGOT(AST): 18 U/L (ref 15–37)
SGPT (ALT): 14 U/L
SODIUM: 142 mmol/L (ref 136–145)
TOTAL PROTEIN: 7.1 g/dL (ref 6.4–8.2)

## 2014-08-22 LAB — URINALYSIS, COMPLETE
BILIRUBIN, UR: NEGATIVE
Blood: NEGATIVE
Glucose,UR: >=500 mg/dL (ref 0–75)
Leukocyte Esterase: NEGATIVE
Nitrite: NEGATIVE
PROTEIN: NEGATIVE
Ph: 5 (ref 4.5–8.0)
RBC,UR: <1 /HPF (ref 0–5)
SPECIFIC GRAVITY: 1.039 (ref 1.003–1.030)
WBC UR: 1 /HPF (ref 0–5)

## 2014-08-22 LAB — LIPASE, BLOOD: LIPASE: 349 U/L (ref 73–393)

## 2014-08-22 LAB — TROPONIN I: Troponin-I: <0.02 ng/mL

## 2014-08-22 LAB — CBC
HCT: 37.9 % (ref 35.0–47.0)
HGB: 11.7 g/dL — AB (ref 12.0–16.0)
MCH: 22.7 pg — AB (ref 26.0–34.0)
MCHC: 30.8 g/dL — ABNORMAL LOW (ref 32.0–36.0)
MCV: 74 fL — AB (ref 80–100)
Platelet: 381 10*3/uL (ref 150–440)
RBC: 5.14 10*6/uL (ref 3.80–5.20)
RDW: 16.9 % — ABNORMAL HIGH (ref 11.5–14.5)
WBC: 8.4 10*3/uL (ref 3.6–11.0)

## 2014-08-22 LAB — D-DIMER(ARMC): D-DIMER: 976 ng/mL

## 2014-08-29 ENCOUNTER — Encounter: Payer: Self-pay | Admitting: General Surgery

## 2014-09-02 ENCOUNTER — Encounter: Payer: Self-pay | Admitting: General Surgery

## 2014-09-02 ENCOUNTER — Ambulatory Visit (INDEPENDENT_AMBULATORY_CARE_PROVIDER_SITE_OTHER): Payer: 59 | Admitting: General Surgery

## 2014-09-02 VITALS — BP 118/72 | HR 79 | Resp 14 | Ht 68.0 in | Wt 175.0 lb

## 2014-09-02 DIAGNOSIS — R1013 Epigastric pain: Secondary | ICD-10-CM

## 2014-09-02 NOTE — Progress Notes (Signed)
Patient ID: Erin Tate, female   DOB: 11/15/1960, 53 y.o.   MRN: [id number]  Chief Complaint  Patient presents with  . Abdominal Pain    HPI Erin Tate is a 53 y.o. female here for stomach pains. She reported progressive symptoms over the 3 days and was subsequently seen in the [facility] ER 08/22/14 for this problem. She was diagnosed with gastritis. (cardiac workup was negative) She last had an upper endoscopy about 1.5 years ago by Dr Shon Hough for an upper GI  bleed. She states the pain has eased with the use of Zantac, but is still there. The episode in 2013 was her second episode of bleeding status post gastric bypass in 2010. This occurred while on Nexium.   The patient's weight has been stable for the last year.  She reports no dysphagia nor increase heartburn symptoms.   HPI  Past Medical History  Diagnosis Date  . Heart murmur   . Thyroid condition 1991  . Diabetes mellitus without complication   . High cholesterol   . Upper GI bleed 2014  . 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 Carpal tunnel release Bilateral 2007,2011  . Gastric bypass  2010  . Upper gi endoscopy  2010, 2014  . Hemorrhoid surgery  1983  . Tonsillectomy  1984  . 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 Abdominal hysterectomy  1997  . Colonoscopy  2006    Dr. Bary Castilla, hyperplastic polyp of the rectum. Diverticulosis.  . Knee surgery Right 2011  . Neck surgery  04/2013  . Subclavical osteoarthritis removal      No family history on file.  Social History History  Substance Use Topics  . Smoking status: Former Smoker -- 1.00 packs/day for 15 years  . Smokeless tobacco: Never Used  . Alcohol Use: No    No Known Allergies  Current Outpatient Prescriptions  Medication Sig Dispense Refill  . Canagliflozin (INVOKANA) 300 MG TABS Take 300 mg by mouth daily.    . DULoxetine (CYMBALTA) 60 MG capsule Take 60 mg by mouth daily.    .  ergocalciferol (VITAMIN D2) 50000 UNITS capsule Take 50,000 Units by mouth once a week.    . levothyroxine (SYNTHROID, LEVOTHROID) 150 MCG tablet Take 150 mcg by mouth daily.    . metFORMIN (GLUMETZA) 500 MG (MOD) 24 hr tablet Take 500 mg by mouth daily with breakfast.    . Multiple Vitamin (MULTIVITAMIN) tablet Take 1 tablet by mouth daily.    . rosuvastatin (CRESTOR) 20 MG tablet Take 20 mg by mouth daily.     No current facility-administered medications for this visit.    Review of Systems Review of Systems  Constitutional: Negative.   Respiratory: Negative.   Cardiovascular: Negative.   Gastrointestinal: Positive for nausea, vomiting and abdominal pain. Negative for diarrhea, constipation, blood in stool, abdominal distention, anal bleeding and rectal pain.    Blood pressure 118/72, pulse 79, resp. rate 14, height 5\' 8"  (1.727 m), weight 175 lb (79.379 kg).  The patient's weight in February 2013 was 227 pounds Physical Exam Physical Exam  Constitutional: She is oriented to person, place, and time. She appears well-developed and well-nourished.  Neck: Neck supple.  Cardiovascular: Normal rate and regular rhythm.   Murmur heard.  Systolic murmur is present with a grade of 2/6  Pulmonary/Chest: Effort normal and breath sounds normal.  Well healed left mastectomy site  Abdominal:  Soft. Bowel sounds are normal. There is tenderness (at the xyphoid).  Lymphadenopathy:    She has no cervical adenopathy.  Neurological: She is alert and oriented to person, place, and time.    Data Reviewed Emergency department records of 08/22/2014. ECG showed normal sinus rhythm with premature atrial complexes. Urinalysis negative. Troponin negative. D-dimer negative. CBC showed a hemoglobin 11.7 with an MCV of 74, white blood cell count of 8400, normal conference metabolic panel. 11/15/2012 CBC showed a hemoglobin of 13.1 with an MCV of 82. All values between that date in November 2015 related to  admissions for GI bleeding with a nadir of 7.4, January 2014. The patient has been evaluated by Janeece Riggers, M.D. from hematology dating back to at least 2012.  CT angiogram of the chest was negative for pulmonary embolism. Chest x-ray was clear. Ultrasound the abdomen was notable only for the absence of the gallbladder. CT of the abdomen and pelvis showed suggestion of wall thickening on the lesser curvature of the stomach. Mild intrahepatic biliary dilatation up to 1.2 cm. No obstructing stone noted. (Likely biliary dilatation status post cholecystectomy and bypass).  Assessment    Abdominal pain, likely secondary to her gastric pouch and gastritis. Previous episodes of upper GI bleeding in spite of PPI therapy.   Iron deficiency anemia likely secondary to poor report patient status post gastric bypass.     Plan    The GI physician who was previously completing her exams has left the area. We'll make arrangements for an upper endoscopy.Patient has been scheduled for an upper endoscopy on 09-11-14 at [facility].      PCP: Dr Idamae Lusher, Forest Gleason 09/03/2014, 7:56 AM

## 2014-09-02 NOTE — Patient Instructions (Addendum)
Esophagogastroduodenoscopy Esophagogastroduodenoscopy (EGD) is a procedure to examine the lining of the esophagus, stomach, and first part of the small intestine (duodenum). A long, flexible, lighted tube with a camera attached (endoscope) is inserted down the throat to view these organs. This procedure is done to detect problems or abnormalities, such as inflammation, bleeding, ulcers, or growths, in order to treat them. The procedure lasts about 5-20 minutes. It is usually an outpatient procedure, but it may need to be performed in emergency cases in the hospital. LET YOUR CAREGIVER KNOW ABOUT:   Allergies to food or medicine.  All medicines you are taking, including vitamins, herbs, eyedrops, and over-the-counter medicines and creams.  Use of steroids (by mouth or creams).  Previous problems you or members of your family have had with the use of anesthetics.  Any blood disorders you have.  Previous surgeries you have had.  Other health problems you have.  Possibility of pregnancy, if this applies. RISKS AND COMPLICATIONS  Generally, EGD is a safe procedure. However, as with any procedure, complications can occur. Possible complications include:  Infection.  Bleeding.  Tearing (perforation) of the esophagus, stomach, or duodenum.  Difficulty breathing or not being able to breath.  Excessive sweating.  Spasms of the larynx.  Slowed heartbeat.  Low blood pressure. BEFORE THE PROCEDURE  Do not eat or drink anything for 6-8 hours before the procedure or as directed by your caregiver.  Ask your caregiver about changing or stopping your regular medicines.  If you wear dentures, be prepared to remove them before the procedure.  Arrange for someone to drive you home after the procedure. PROCEDURE   A vein will be accessed to give medicines and fluids. A medicine to relax you (sedative) and a pain reliever will be given through that access into the vein.  A numbing medicine  (local anesthetic) may be sprayed on your throat for comfort and to stop you from gagging or coughing.  A mouth guard may be placed in your mouth to protect your teeth and to keep you from biting on the endoscope.  You will be asked to lie on your left side.  The endoscope is inserted down your throat and into the esophagus, stomach, and duodenum.  Air is put through the endoscope to allow your caregiver to view the lining of your esophagus clearly.  The esophagus, stomach, and duodenum is then examined. During the exam, your caregiver may:  Remove tissue to be examined under a microscope (biopsy) for inflammation, infection, or other medical problems.  Remove growths.  Remove objects (foreign bodies) that are stuck.  Treat any bleeding with medicines or other devices that stop tissues from bleeding (hot cautery, clipping devices).  Widen (dilate) or stretch narrowed areas of the esophagus and stomach.  The endoscope will then be withdrawn. AFTER THE PROCEDURE  You will be taken to a recovery area to be monitored. You will be able to go home once you are stable and alert.  Do not eat or drink anything until the local anesthetic and numbing medicines have worn off. You may choke.  It is normal to feel bloated, have pain with swallowing, or have a sore throat for a short time. This will wear off.  Your caregiver should be able to discuss his or her findings with you. It will take longer to discuss the test results if any biopsies were taken. Document Released: 02/04/2005 Document Revised: 02/18/2014 Document Reviewed: 09/06/2012 [facility] Patient Information 2015 [place], [place]. This information is not  intended to replace advice given to you by your health care provider. Make sure you discuss any questions you have with your health care provider.  Start Omeprazole BID  Patient has been scheduled for an upper endoscopy on 09-11-14 at [facility].

## 2014-09-03 ENCOUNTER — Encounter: Payer: Self-pay | Admitting: General Surgery

## 2014-09-03 ENCOUNTER — Other Ambulatory Visit: Payer: Self-pay | Admitting: General Surgery

## 2014-09-03 DIAGNOSIS — R1013 Epigastric pain: Secondary | ICD-10-CM | POA: Insufficient documentation

## 2014-09-11 ENCOUNTER — Ambulatory Visit: Payer: Self-pay | Admitting: General Surgery

## 2014-09-11 DIAGNOSIS — K289 Gastrojejunal ulcer, unspecified as acute or chronic, without hemorrhage or perforation: Secondary | ICD-10-CM

## 2014-09-11 DIAGNOSIS — K259 Gastric ulcer, unspecified as acute or chronic, without hemorrhage or perforation: Secondary | ICD-10-CM

## 2014-10-15 ENCOUNTER — Encounter: Payer: Self-pay | Admitting: General Surgery

## 2014-10-15 ENCOUNTER — Telehealth: Payer: Self-pay | Admitting: *Deleted

## 2014-10-15 NOTE — Telephone Encounter (Signed)
-----   Message from Robert Bellow, MD sent at 10/12/2014 11:19 AM EST ----- Please follow-up with the patient regarding her upper abdominal pain. She underwent endoscopy in November with findings of a gastric ulcer. She should likely consider repeat endoscopy at the end of January to see if these areas have healed. Thank you

## 2014-10-16 NOTE — Telephone Encounter (Signed)
Notified patient as instructed, patient pleased. Discussed follow-up appointments, patient agrees  

## 2014-11-04 ENCOUNTER — Encounter: Payer: Self-pay | Admitting: General Surgery

## 2014-11-04 ENCOUNTER — Ambulatory Visit (INDEPENDENT_AMBULATORY_CARE_PROVIDER_SITE_OTHER): Payer: 59 | Admitting: General Surgery

## 2014-11-04 VITALS — BP 140/70 | HR 76 | Resp 14 | Ht 68.0 in | Wt 177.0 lb

## 2014-11-04 DIAGNOSIS — K289 Gastrojejunal ulcer, unspecified as acute or chronic, without hemorrhage or perforation: Secondary | ICD-10-CM

## 2014-11-04 DIAGNOSIS — R1013 Epigastric pain: Secondary | ICD-10-CM

## 2014-11-04 NOTE — Patient Instructions (Addendum)
Patient has been scheduled for an upper endoscopy on 11-13-14 at [facility]. This patient will hold all diabetic medications the day of procedure.

## 2014-11-04 NOTE — Progress Notes (Addendum)
Patient ID: Candita Borenstein, female   DOB: 08-May-1961, 54 y.o.   MRN: [id number]  Chief Complaint  Patient presents with  . Pre-op Exam    endoscopy     HPI Keylen Kinlee Garrison is a 54 y.o. female here today for her pre op endoscopy scheduled for 11/13/14. The patient had been noted with a jejunal ulcer in the past when she presented with nausea hematemesis and rectal bleeding. She had been identified with episodic abdominal pain this past November without CT evidence of obstruction or internal hernia. The patient denies any further vomiting or blood per rectum but is continuing to have intermittent epigastric pain that so severe as it stops her activities. The patient reports that the pain is as if a hot poker is being stabbed below her sternum. There is no bloating, pressure or radiation.  Upper endoscopy completed 09/11/2014 showed evidence of a 3 mm gastric ulcer as well as a 10 mm jejunal ulcer just below the gastrojejunal anastomosis.  Patient had a CT scan and ultrasound done in November 2015. HPI  Past Medical History  Diagnosis Date  . Heart murmur   . Thyroid condition 1991  . Diabetes mellitus without complication   . High cholesterol   . Upper GI bleed 2014  . 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 Carpal tunnel release Bilateral 2007,2011  . Gastric bypass  2010  . Upper gi endoscopy  2010, 2014  . Hemorrhoid surgery  1983  . Tonsillectomy  1984  . 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 Abdominal hysterectomy  1997  . Colonoscopy  2006    Dr. Bary Castilla, hyperplastic polyp of the rectum. Diverticulosis.  . Knee surgery Right 2011  . Neck surgery  04/2013  . Subclavical osteoarthritis removal      No family history on file.  Social History History  Substance Use Topics  . Smoking status: Former Smoker -- 1.00 packs/day for 15 years  . Smokeless tobacco: Never Used  . Alcohol Use: No    No Known  Allergies  Current Outpatient Prescriptions  Medication Sig Dispense Refill  . Canagliflozin (INVOKANA) 300 MG TABS Take 300 mg by mouth daily.    . DULoxetine (CYMBALTA) 60 MG capsule Take 60 mg by mouth daily.    . ergocalciferol (VITAMIN D2) 50000 UNITS capsule Take 50,000 Units by mouth once a week.    . levothyroxine (SYNTHROID, LEVOTHROID) 150 MCG tablet Take 150 mcg by mouth daily.    . metFORMIN (GLUMETZA) 500 MG (MOD) 24 hr tablet Take 500 mg by mouth daily with breakfast.    . Multiple Vitamin (MULTIVITAMIN) tablet Take 1 tablet by mouth daily.    . rosuvastatin (CRESTOR) 20 MG tablet Take 20 mg by mouth daily.     No current facility-administered medications for this visit.    Review of Systems Review of Systems  Constitutional: Negative.   Respiratory: Negative.   Cardiovascular: Negative.     Blood pressure 140/70, pulse 76, resp. rate 14, height 5\' 8"  (1.727 m), weight 177 lb (80.287 kg).  Physical Exam Physical Exam  Constitutional: She appears well-developed and well-nourished.  Eyes: Conjunctivae are normal. No scleral icterus.  Neck: Neck supple.  Cardiovascular: Normal rate and regular rhythm.   Murmur heard.  Systolic murmur is present with a grade of 3/6  Pulmonary/Chest: Effort normal and breath sounds normal.  Abdominal: Soft. Normal appearance and bowel  sounds are normal. There is no hepatomegaly. There is no tenderness.  Lymphadenopathy:    She has no cervical adenopathy.  Neurological: She is alert.  Skin: Skin is warm and dry.    Data Reviewed ED records and labs. Hemogram dated 08/22/2014 showed a hemoglobin of 11.7 with an MCV of 74, white blood cell count of 8400. Creatinine 0.58. Normal liver function studies. Ultrasound did show dilatation of the common bile duct of 1.2 cm status post cholecystectomy.  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    Plan    Discussed repeat upper endoscopy to determine if the  ulcers have healed on Prilosec 40 mg by mouth twice a day prescribed at the time of her November 2015 endoscopy.   The patient was given a prescription for Norco 5/325, #30 with the inscription use 1 by mouth every 4 hours when necessary for severe pain. No refills.    Patient has been scheduled for an upper endoscopy on 11-13-14 at [facility]. This patient will hold all diabetic medications the day of procedure.    PCP: Dr Idamae Lusher, Forest Gleason 11/05/2014, 7:31 PM

## 2014-11-05 ENCOUNTER — Other Ambulatory Visit: Payer: Self-pay | Admitting: General Surgery

## 2014-11-05 DIAGNOSIS — K289 Gastrojejunal ulcer, unspecified as acute or chronic, without hemorrhage or perforation: Secondary | ICD-10-CM

## 2014-11-05 DIAGNOSIS — R1013 Epigastric pain: Secondary | ICD-10-CM

## 2014-11-06 ENCOUNTER — Telehealth: Payer: Self-pay | Admitting: General Surgery

## 2014-11-06 NOTE — Telephone Encounter (Signed)
11-06-14 PT CALLED & STATED DR BYRNETT CALLED HER & LEFT A MESSAGE. HE WAS CHECKING TO SEE IF SHE WAS TAKING PRILOSEC 40/MG TWICE A DAY AS HE PRESCRIBED. SHE CONFIRMS SHE'S TAKING  PRILOSEC AS PRESCRIBE./MTH

## 2014-11-13 ENCOUNTER — Ambulatory Visit: Payer: Self-pay | Admitting: General Surgery

## 2014-11-13 ENCOUNTER — Encounter: Payer: Self-pay | Admitting: General Surgery

## 2014-11-13 DIAGNOSIS — K2950 Unspecified chronic gastritis without bleeding: Secondary | ICD-10-CM

## 2014-11-13 LAB — CBC WITH DIFFERENTIAL/PLATELET
BASOS PCT: 1.4 %
Basophil #: 0.1 10*3/uL (ref 0.0–0.1)
Eosinophil #: 0.2 10*3/uL (ref 0.0–0.7)
Eosinophil %: 3 %
HCT: 40.3 % (ref 35.0–47.0)
HGB: 12.4 g/dL (ref 12.0–16.0)
LYMPHS ABS: 2.1 10*3/uL (ref 1.0–3.6)
Lymphocyte %: 26.8 %
MCH: 21.7 pg — AB (ref 26.0–34.0)
MCHC: 30.7 g/dL — ABNORMAL LOW (ref 32.0–36.0)
MCV: 71 fL — ABNORMAL LOW (ref 80–100)
Monocyte #: 0.4 x10 3/mm (ref 0.2–0.9)
Monocyte %: 5.6 %
NEUTROS ABS: 4.9 10*3/uL (ref 1.4–6.5)
Neutrophil %: 63.2 %
Platelet: 435 10*3/uL (ref 150–440)
RBC: 5.7 10*6/uL — ABNORMAL HIGH (ref 3.80–5.20)
RDW: 16.3 % — ABNORMAL HIGH (ref 11.5–14.5)
WBC: 7.7 10*3/uL (ref 3.6–11.0)

## 2014-11-14 ENCOUNTER — Telehealth: Payer: Self-pay | Admitting: *Deleted

## 2014-11-14 ENCOUNTER — Encounter: Payer: Self-pay | Admitting: General Surgery

## 2014-11-14 NOTE — Telephone Encounter (Signed)
Message for patient to call the office.  

## 2014-11-14 NOTE — Telephone Encounter (Signed)
-----   Message from Robert Bellow, MD sent at 11/14/2014  3:13 PM EST ----- Please let the patient know that she will be contacted by Cammie Sickle, MD's office assistant, Royann Shivers at [facility] to arrange a follow up appointment.  She will need to get a copy of her her August 22, 2014 CT scans as well as her nFebruary 3, 2014 bleeding scan to take with her.  Request a hard copy of her endoscopies from Tr[facility]: January 2014 through yesterday. Thanks.Marland Kitchen

## 2014-11-15 NOTE — Telephone Encounter (Signed)
Spoke with patient and let her know that [employer] Farr office assistant would be calling her. She said that they called and left her a message yesterday and that she would be calling them back today. I let her know to be sure to get the copies of her November CT scans as well as her February 2014 bleeding scan to take with her. I let her know that I had requested hard copies of all her endoscopies and that we would call her to pick these from our office once we receive these.

## 2014-11-18 NOTE — Telephone Encounter (Signed)
Message for patient to call the office.   We have color copies of endo reports.

## 2014-11-19 ENCOUNTER — Telehealth: Payer: Self-pay

## 2014-11-19 NOTE — Telephone Encounter (Signed)
Patient returned call and I notified her that we have the color copies of her Endoscopies and that she may pick up these any time. She would just need to sign a release. Patient understands and will be by this week to pick these up.

## 2014-11-25 ENCOUNTER — Encounter: Payer: Self-pay | Admitting: General Surgery

## 2014-12-30 ENCOUNTER — Ambulatory Visit: Payer: 59 | Admitting: General Surgery

## 2015-01-30 ENCOUNTER — Other Ambulatory Visit: Payer: Self-pay

## 2015-01-30 DIAGNOSIS — Z853 Personal history of malignant neoplasm of breast: Secondary | ICD-10-CM

## 2015-01-31 ENCOUNTER — Other Ambulatory Visit: Payer: Self-pay | Admitting: General Surgery

## 2015-01-31 DIAGNOSIS — Z853 Personal history of malignant neoplasm of breast: Secondary | ICD-10-CM

## 2015-02-07 NOTE — Consult Note (Signed)
CC: GI bleeding.  EGD showed ulcer and no active bleeding.  Will start sips of water today and clear liquids without carbonization tomorrow.  Electronic Signatures: Manya Silvas (MD)  (Signed on 29-Jan-14 11:56)  Authored  Last Updated: 29-Jan-14 11:56 by Manya Silvas (MD)

## 2015-02-07 NOTE — Consult Note (Signed)
PATIENT NAME:  Erin Tate, Erin Tate MR#:  [id number] DATE OF BIRTH:  [birth year]  DATE OF CONSULTATION:  11/15/2012  REFERRING PHYSICIAN:   CONSULTING PHYSICIAN:  Manya Silvas, MD  HISTORY OF PRESENT ILLNESS: The patient is a 54 year old white female who has been taking Naprosyn for arthritic problems and has had previous gastric bypass surgery a few years ago who had the onset of hematemesis and upper abdominal pain. She had bright red blood in vomitus x 3. In the ER, she had a near syncopal episode. She has also had melena. She was admitted to the intensive care unit. Her hemoglobin dropped from 12 to 9 and I was asked to see her in consultation.   She has had an ulcer with bleeding, in bypass gastric area, in the past. She had gastric bypass surgery done a few years ago. She had a colonoscopy in the past by Dr. Allen Norris. Her gastric bypass was done in 2010 and in 2011 she had melena and had a nonbleeding ulcer on endoscopy.   She has been taking about 5 tablets of Naprosyn a day for osteoarthritis of the sternoclavicular joint.   The patient is not a Jehovah's Witness.   REVIEW OF SYSTEMS:  GENERAL: No chills or fever. No changes in vision or hearing. No chest pain. No shortness of breath. She does have seasonal allergies that she uses Advair for.  CARDIOVASCULAR: No cardiac complaints. GENITOURINARY: No dysuria or hematuria.  SKIN: No rashes.  PSYCHIATRIC: She is on Cymbalta.   PAST MEDICAL HISTORY: Gastric bypass surgery for obesity 2010, duodenal ulcer 2011. She has diabetes type 2. History of hypothyroidism, asthma, migraine headaches, hypertension, hyperlipidemia, endometriosis and diabetic neuropathy. History of breast cancer with left mastectomy several years ago.   PAST SURGICAL HISTORY: Left mastectomy, gastric bypass, cholecystectomy, hysterectomy and carpal tunnel syndrome.   HABITS: Rare alcohol. Smokes about 1/2 pack a day.   SOCIAL HISTORY: Works with [employer]  HISTORY: Father died from stroke. Mother had small-cell carcinoma of the lung with mets to the liver.   MEDICATIONS ON ADMISSION: Cymbalta 60 mg a day, Glumetza 500 mg once a day, Synthroid 150 mcg once a day, Victoza 1.8 mg once a day, vitamin D and over-the-counter Naprosyn at 220 mg 5 a day.   ALLERGIES: No known drug allergies.   PHYSICAL EXAMINATION:  GENERAL: White female in no acute distress, looks a little pale.  VITAL SIGNS: Blood pressure 96/55, pulse 86, respirations 18 and oxygen saturation 98%.  HEENT: Sclerae anicteric. Conjunctivae slightly pale. Tongue and fingernail beds slightly pale.  HEAD: Atraumatic.  CHEST: Clear in anterolateral fields.  HEART: No murmurs or gallops that I can hear.  ABDOMEN: Small old scars from gastric bypass. There is tenderness in the epigastric and left upper quadrant. No palpable masses. No palpable liver.  SKIN: Warm and dry.  EXTREMITIES: No edema.  PSYCHIATRIC: Mood and affect are appropriate.  RECTAL: Exam not done at this time.   LAB DATA: Glucose 144, BUN 23, creatinine 0.7, sodium 142, potassium 4.1, chloride 109, CO2 21, calcium 8.8, total protein 6.8, albumin 3.8, total bilirubin 0.5, alkaline phosphatase 79, SGOT 16 and SGPT 19. White blood count 18. Initial hemoglobin 13.1, done at 12:52 a.m. Repeat 7 hours later shows hemoglobin at 9.1. Platelet count 439. O negative with negative antibody screen. Pro time 13.4. PTT 36.   ASSESSMENT: Upper gastrointestinal bleed from an ulceration of the stomach, anastomotic area, jejunal area.  PLAN:  1. Give PPI  even though it may not be very effective given denervated stomach.   2. Serial hemoglobins. 3. Upper endoscopy later this morning for evaluation and possible treatment of ulceration. ____________________________ Manya Silvas, MD rte:sb D: 11/15/2012 09:12:52 ET T: 11/15/2012 09:42:10 ET JOB#: [id number]  cc: Manya Silvas, MD, <Dictator> Srikar R. Darvin Neighbours, MD Lenard Simmer, MD Connye Burkitt. Lovena Le, MD [place] Lenore Manner, MD Manya Silvas MD ELECTRONICALLY SIGNED 12/07/2012 7:56

## 2015-02-07 NOTE — Discharge Summary (Signed)
PATIENT NAME:  Erin Tate, Erin Tate MR#:  [id number] DATE OF BIRTH:  25-Oct-1960  DATE OF ADMISSION:  11/15/2012 DATE OF DISCHARGE:  11/18/2012  CONSULTANT: Dr. Vira Agar from GI.   PRIMARY CARE PHYSICIAN: Dr. Francoise Schaumann.   CHIEF COMPLAINT: Hematemesis.   DISCHARGE DIAGNOSES: 1.  Hematemesis likely of problem upper gastrointestinal bleed.  2.  Gastritis.  3.  Nonbleeding small bowel ulcer without any visible vessel and no clot.  4.  Symptomatic iron deficiency, acute anemia, likely from upper gastrointestinal bleed.  5.  History of obesity status post gastric bypass in 2010.  6.  History of duodenal ulcer by esophagogastroduodenoscopy in 2011.  7.  Diabetes type 2.  8.  Hypothyroidism.  9.  Gastroesophageal reflux disease.   10.  Asthma.  11.  Migraines.  12.  Hypertension.  13.  Hyperlipidemia.  14.  Fatty infiltration of the liver.  15.  History of diabetic neuropathy.  16.  History of breast cancer.   DISCHARGE MEDICATIONS: 1.  Synthroid 150 mcg daily.  2.  Multivitamin 1 tab daily.  3.  Cymbalta 60 mg daily.  4.  Glumetza 500 mg, 2 tabs once a day.  5.  Vitamin D2 50,000 international units oral capsule 1 cap once a week.  6.  Invokana 300 mg oral tablet 1 tab once a day.  7.  Sucralfate 1 gram part 10 mL oral suspension. Please take 10 mL 4 times daily before meals and at bedtime.  8.  Pantoprazole 40 mg 1 tab, 2 times a day.   DIET: Low sodium, ADA diet.   ACTIVITY: As tolerated.   RETURN TO WORK:  She may return to work on 11/27/2012.    FOLLOW-UP: Please follow with PCP and check a hemiglobin the middle of next week. Please follow with Dr. Vira Agar within 1 to 2 weeks. Please follow with Dr. Inez Pilgrim for an evaluation for need for iron infusion. If  any more bleeding, shortness of breath, dizziness,  dark tarry stools or any other complaints or symptoms, call your doctor right away.   DISPOSITION: Home.  CODE STATUS:  Full code.   HISTORY OF PRESENT ILLNESS: For full  details of history and physical, please see the dictation on 11/15/2012 by Dr. Lenore Manner, but briefly, this is a pleasant 54 year old female with a history of obesity status post gastric bypass, history of melena and nonbleeding duodenal ulcer in the past, who presented with hematemesis x 2 with feeling of dizziness, weakness. She had taken some Naprosyn as an outpatient, over-the-counter for osteoarthritis and sternoclavicular joint inflammation.   SIGNIFICANT LABS AND IMAGING: Initial BUN 23, creatinine 0.71, chloride 109, sodium 142, potassium 4.2. LFTs within normal limits. Initial WBC 18, initial hemoglobin 13, these were all on January 29. Lowest hemoglobin of 7.4 on January 31. Last hemoglobin 8.5 on February 1. INR 1. PT, on arrival, 13.4 and PTT of 36.1.   HOSPITAL COURSE: The patient was admitted to the hospitalist service in the CCU given the hematemesis and hemoglobin were serially followed. There was a significant drop in the hemoglobin. The patient did have symptomatic anemia. The patient was seen by GI, Dr. Vira Agar, on arrival. She underwent upper endoscopy, the result of which is dictated above. The patient was noted to have a nonbleeding small bowel ulcer without clot or bright red blood at the spot. The patient also had gastritis. She was started on Protonix drip, some IV fluids. The patient had no further bleeding episodes and hemoglobin did trend down to the  7s, however.  At that point, the decision was made to transfuse her with 1 unit for symptomatic anemia, as she had significant lack of energy, dizziness, worse with movement and shortness of breath and dyspnea on exertion. The hemoglobin did trend up post transfusion and the patient symptomatically feels better. Iron studies were also done. The patient was noted to have a low ferritin of 7 with serum iron of 50. The patient used to see Dr. Inez Pilgrim for iron transfusion and the patient was transfused 1 time iron. She has felt symptomatically  better post transfusion of PRBC and is ambulating, tolerating her diet and at this point she will be discharged with Protonix b.i.d. and Carafate. The patient will not take NSAIDs at this time and we will follow up with GI and Dr. Inez Pilgrim as an outpatient and check her hemoglobin the middle of next week.   While hospitalized, her outside medicines were continued, but her diabetes medicines were held and she was started on sliding scale insulin and blood sugars have been stable.   TOTAL TIME SPENT: 35 minutes.   ____________________________ Vivien Presto, MD sa:cc D: 11/18/2012 13:36:46 ET T: 11/19/2012 18:06:37 ET JOB#: [id number]  cc: Vivien Presto, MD, <Dictator> Manya Silvas, MD Simonne Come. Inez Pilgrim, MD Dr. Chana Bode [facility] MD ELECTRONICALLY SIGNED 11/23/2012 74:93

## 2015-02-07 NOTE — Consult Note (Signed)
CC: GI bleed.  Pt hgb low but stable.  No signif postural signs. No vomiting, no new abd pain, no new bleeding.  Consider iv iron since she will not absorb it well oral and she does not qualify for blood transfusion.  Iron sucrose would be drug of choice.  Should stay on carafate for her ulcer.    Electronic Signatures: Manya Silvas (MD)  (Signed on 30-Jan-14 14:34)  Authored  Last Updated: 30-Jan-14 14:34 by Manya Silvas (MD)

## 2015-02-07 NOTE — H&P (Signed)
PATIENT NAME:  Erin Tate, STORMER MR#:  [id number] DATE OF BIRTH:  Mar 08, 1961  DATE OF ADMISSION:  11/20/2012  PRIMARY CARE PHYSICIAN:  Dr. Ronnald Collum.  ED REFERRING PHYSICIAN:  Dr. Michel Santee.  CHIEF COMPLAINT: Dark tarry stools, at least 5 to 6 large bowel movements.   HISTORY OF PRESENT ILLNESS: The patient is a pleasant 54 year old white female with history of diabetes and obesity who has undergone a gastric bypass surgery in 2000 and then subsequently had developed melena in 2011. Had upper EGD which showed a nonbleeding duodenal ulcer. The patient was doing well since then, however, she started taking naproxen over-the-counter for osteoarthritis. She was hospitalized again on 11/15/2012 with complaint of hematemesis. She was also hypotensive and tachycardic. She was evaluated and had an EGD was thought to have hematemesis, likely due to upper GI bleed. She also had iron deficiency anemia and received iron transfusion. The patient also received 1 unit of packed RBCs. She was discharged home in stable condition. The patient reports that since yesterday she had 3 large tarry bowel movements and then again today had 2 more. She came to the ED and in the ED, she was noted to have some mild orthostatic blood pressure changes. Her hemoglobin actually looks better than her discharge hemoglobin. The patient has not had any shortness of breath, chest pain or palpitations or syncope. She does complain of abdominal distention. Denies any chest pains or palpitations.   PAST MEDICAL HISTORY: 1.  History of duodenal ulcer in the past.  2.  Recent admission for hematemesis as above.  3.  History of gastritis.  4.  History of nonbleeding small bowel ulcer which was noted on recent EGD. 5.  Iron deficiency anemia, status post iron infusion during recent hospitalization.  6.  History of obesity, status post gastric bypass 2010.  7.  History of duodenal ulcer per EGD 2011.  8.  Diabetes type 2.  9.  Hypothyroidism.   10.  GERD 11.  Asthma.  12.  Migraines.  13.  Hypertension.  14.  Hyperlipidemia.  15.  Fatty infiltration of the liver. 16.  History of diabetic neuropathy.  17.  History of breast cancer. 18.  History of endometriosis.   PAST SURGICAL HISTORY: 1.  Breast cancer, status post left mastectomy. 2.  Gastric bypass surgery.  3.  Status post cholecystectomy.  4.  Status post hysterectomy.  5.  Status post carpal tunnel surgery.   ALLERGIES: No drug allergies.   SOCIAL HISTORY: Continues to smoke less than a pack. No alcohol or drug use.   FAMILY HISTORY: Her father died from a CVA. Mother died from small cell lung cancer.   CURRENT MEDICATIONS: Synthroid 150 mcg daily.  Multivitamin 1 tab p.o. daily. Cymbalta 60 mg daily. Glumetza 500, 2 tabs daily.  Vitamin D2 50,000 international units daily.  Invokana 300 mg daily. Sucralfate 1 gram 4 times per day. Protonix 40, 1 tab p.o. b.i.d.    REVIEW OF SYSTEMS:  CONSTITUTIONAL: Denies any fevers. No chills.  Complains of mild generalized weakness.  EYES: No blurred vision. No double vision. No cataracts. No glaucoma.  EARS, NOSE AND THROAT:  Denies any hearing difficulties. No sore throat. No dysphagia.  CARDIOVASCULAR: No chest pain. No shortness of breath. No edema. No syncope.  PULMONARY: No shortness of breath, no COPD, no tuberculosis.  GASTROINTESTINAL  Tarry stool as above.  Complains of abdominal distention. No abdominal pain.  GENITOURINARY: Denies any dysuria, frequency, urgency.  MUSCULOSKELETAL: Denies any joint pains.  No gout.  NEUROLOGIC: Denies any focal weakness. No seizures, CVA. No TIA.  PSYCHIATRIC: Has a history of anxiety. No depression.  ENDOCRINE: Denies any polyuria or polyphagia. Has diabetes.  HEMATOLOGIC: Denies easy bruisability or bleeding.   PHYSICAL EXAMINATION: VITAL SIGNS: Temperature 98.5, pulse 61, respirations 20, blood pressure 112/74, O2 98% on room air.  GENERAL: The patient is a slightly obese  female in no acute distress.  HEENT: Head atraumatic, normocephalic. Pupils equally round, reactive to light and accommodation. There is no conjunctival pallor. No scleral icterus. Nasal exam showed no drainage or ulceration.  Oropharynx is clear without any exudate. NECK: No thyromegaly. No carotid bruits.  CARDIOVASCULAR: Regular rate and rhythm. No murmurs, rubs, clicks or gallops. PMI is not displaced.  LUNGS: Clear to auscultation bilaterally without any rales, rhonchi or wheezing.  ABDOMEN: Soft. Mild distention. There is no guarding. No rebound.  EXTREMITIES: No clubbing, cyanosis or edema.  SKIN: No rash.  LYMPHATICS: No lymph nodes palpable.  VASCULAR: Good DP, PT pulses.  PSYCHIATRIC: Not anxious or depressed.  NEUROLOGICAL: Awake, alert, oriented x 3. No focal deficits.   LABORATORY AND DIAGNOSTIC DATA:  Glucose 111, BUN 6, creatinine 0.66, sodium 144, potassium 3.5, chloride 111, CO2 25. LFTs are normal. WBC 7.5, hemoglobin 10.3, platelet count 439, INR 0.9, lipase is 182.   ASSESSMENT AND PLAN: The patient is a 54 year old recently hospitalized for upper gastrointestinal bleed.  Had an esophagogastroduodenoscopy that showed a small nonbleeding ulcer. She was discharged home, now returns with 5 episodes of large tarry stools.  There were some orthostatic changes in her blood pressure in the  Emergency Room.    1.  Hematochezia with 5 tarry stools since discharge, possible recurrent bleed, possible other sources of bleed including bleeding from her small bowel; however, that would not give you hematochezia. At this time, bleeding scan is ordered, which likely will be low yield due to it being a lower gastrointestinal bleed scan but we will await the results. Currently her hemoglobin is stable. We will get gastrointestinal evaluation. May need capsule endoscopy. Will place her on intravenous Protonix. Continue Carafate.  2.  Hypothyroidism. Continue Synthroid.  3.  Diabetes. We will  continue Glumetza.  She will be on a clear liquid diet so will hold the Envokana.  4.  Asthma stable, not on any treatment.  Follow her respiratory status.  5.  Depression. Will continue Cymbalta.  6.  Miscellaneous.  We will place her on sequential compression devices for deep vein thrombosis prophylaxis.   Time spent on this admission:  45 minutes.    ____________________________ Lafonda Mosses Posey Pronto, MD shp:ct D: 11/20/2012 18:07:55 ET T: 11/20/2012 18:42:07 ET JOB#: [id number]  cc: Syon Tews H. Posey Pronto, MD, <Dictator> Alric Seton MD ELECTRONICALLY SIGNED 11/25/2012 8:35

## 2015-02-07 NOTE — H&P (Signed)
PATIENT NAME:  Erin Tate, Erin Tate MR#:  [id number] DATE OF BIRTH:  1961/04/10  DATE OF ADMISSION:  11/15/2012  PRIMARY CARE PHYSICIAN:  Dr. Ronnald Collum.   REFERRING PHYSICIAN:  Dr. Marta Antu.   CHIEF COMPLAINT:  Vomiting bright red blood x 2.   HISTORY OF PRESENT ILLNESS:  The patient is a 54 year old female with history of diabetes and obesity.  She is status post a gastric bypass surgery in 2010, then the following year in 2011 she had melena.  Then, subsequently she had upper endoscopy which showed a nonbleeding duodenal ulcer.  The patient was in her usual state of health, but reported that since November she is taking at least 5 tablets of Naprosyn over-the-counter daily for osteoarthritis of the sternoclavicular joint.  The patient was at work, then developed feeling of nausea and when she went to the bathroom she vomited a large amount of bright red blood, total 2 episodes.  After that she felt dizzy and weak and then she was brought to the hospital here.  Evaluation here revealed hypotension with blood pressure was reaching 99/61 and she was tachycardic initially, heart rate was 139 per minute.  She received a liter of fluid and she was stabilized.  Blood pressure improved and her tachycardia improved as well.  The patient is in the process to be admitted to the intensive care unit for further evaluation of her upper GI bleed.  She denies any abdominal pain prior to her vomiting, however after the vomiting she developed epigastric discomfort feeling, but no actual pain.  Denies any melena and no hematochezia.   REVIEW OF SYSTEMS:  CONSTITUTIONAL:  Denies any fever.  No chills, but she had mild generalized weakness.  EYES:  No blurring of vision.  No double vision.  EARS, NOSE, THROAT:  No hearing impairment.  No sore throat.  No dysphagia.  CARDIOVASCULAR:  No chest pain.  No shortness of breath.  No edema.  No syncope, but she had feeling of near fainting earlier after the vomiting.   RESPIRATORY:  No shortness of breath.  No chest pain.  No cough.  No sputum production.  GASTROINTESTINAL:  Hematemesis as above.  No abdominal pain, no melena.  No hematochezia.  GENITOURINARY:  No dysuria.  No frequency of urination.  MUSCULOSKELETAL:  No joint pain other than the sternoclavicular joint osteoarthritic pain.  No joint swelling.  No muscular pain or swelling.  INTEGUMENTARY:  No skin rash.  No ulcers.  NEUROLOGY:  No focal weakness.  No seizure activity.  No headache.  PSYCHIATRY:  She has history of anxiety, but she is not anxious now.  No depression.  ENDOCRINE:  No polyuria or polydipsia.  No heat or cold intolerance.  HEMATOLOGY:  No easy bruisability.  No lymph node enlargement.  The patient has history of iron deficiency anemia.   PAST MEDICAL HISTORY:  The patient has history of obesity status post gastric bypass in 2010, duodenal ulcer by EGD in 2011, diabetes mellitus type 2, hypothyroidism, gastroesophageal reflux disease, asthma, migraine headaches, hypertension, hyperlipidemia, fatty infiltration of the liver, endometriosis, diabetic neuropathy.  Also, she has history of breast cancer.   PAST SURGICAL HISTORY:  Breast cancer status post left mastectomy, gastric bypass surgery, cholecystectomy, hysterectomy, carpal tunnel surgery.   SOCIAL HABITS:  Unfortunately, she smokes less than a pack a day since the age of 11.  She quit for 9 years and then she went back smoking.  No history of alcohol or drug abuse.  SOCIAL HISTORY:  She is married, living with her husband.  She works with [employer].   FAMILY HISTORY:  Her father died from stroke.  Her mother died last year, in 01-27-12, after a finding of small cell lung cancer with metastasis to the liver.   ADMISSION MEDICATIONS:  Cymbalta 60 mg a day, Glumetza 500 mg once a day, Synthroid 150 mcg once a day, Victoza 1.8 mg once a day, vitamin D.  She is taking over-the-counter Naprosyn 5 tablets a day.  The dose is unclear,  but usually over-the-counter dose is 220 mg.   ALLERGIES:  No known drug allergies.   PHYSICAL EXAMINATION: VITAL SIGNS:  Blood pressure 99/61, pulse was 140 earlier, now it is 95, respiratory rate is 18, pulse oximetry is 98%.  GENERAL APPEARANCE:  This is a middle-aged female lying in bed in no acute distress.  She feels much comfortable now.  HEAD AND NECK:  No pallor.  No icterus.  No cyanosis.  EARS, NOSE, THROAT:  Ear examination revealed normal hearing, no ulcers, no discharge.  Nasal mucosa was normal, no lesions, no discharge.  Oropharyngeal area was normal without oral thrush, no ulcers.  EYES:  Examination revealed normal eyelids and conjunctivae.  Pupils about 6 to 7 mm, equal and reactive to light.  Both pupils are round.  NECK:  Supple.  Trachea at midline.  No masses.  HEART:  Normal S1, S2.  No S3, S4.  No murmur.  No gallop.  No carotid bruits.  RESPIRATORY:  Normal breathing pattern without use of accessory muscles.  No rales.  No wheezing.  ABDOMEN:  Soft without tenderness.  No hepatosplenomegaly.  No masses.  No hernias.  No rebound.  No rigidity.  MUSCULOSKELETAL:  No joint swelling.  No clubbing.  SKIN:  No ulcers.  No subcutaneous nodules.  NEUROLOGIC:  Cranial nerves II-XII are intact.  No focal motor deficit.  PSYCHIATRIC:  The patient is alert, oriented x 3.  Mood and affect were normal.   LABORATORY FINDINGS:  EKG showed sinus tachycardia at rate of 139 per minute, otherwise unremarkable EKG.  Serum glucose 262, BUN 23, creatinine 0.7, sodium 142, potassium 4.1.  Normal liver function tests and liver transaminases.  CBC showed white count of 18,000, hemoglobin 13, hematocrit 39, platelet count 439,000.  Prothrombin time 13.  INR 1. APTT 36.   ASSESSMENT: 1.  Upper gastrointestinal bleed presenting with hematemesis x 2 episodes.  2.  Hypotension secondary to upper gastrointestinal bleed.  3.  Leukocytosis, most likely secondary to reaction to the acute  gastrointestinal bleed.  4.  Diabetes mellitus, type 2.  5.  History of gastric bypass in 2009/01/26.  6.  History of duodenal ulcer in 01/26/10.  7.  Hypothyroidism.  8.  Osteoarthritis.  9.  Hypertension.  10.  Hyperlipidemia.  11.  Peripheral neuropathy.  12.  History of cholecystectomy.  13.  Breast cancer status post left mastectomy.  14.  Finally, the patient is a chronic smoker.   PLAN:  We will admit the patient to the intensive care unit.  After 1 liter of IV normal saline, the patient will be on 150 mL per hour.  Check hemoglobin q. 6 hours x 3.  GI consultation with Dr. Vira Agar.  Keep patient nothing by mouth in preparation for upper endoscopy.  I will hold her home medications to be resumed later after the EGD.  The patient was advised to quit taking nonsteroidal anti-inflammatory medication.  Certainly she was exceeding  her dose with the Naprosyn.  The patient received 40 mg of IV Protonix and I will continue intravenous drip of Protonix.  Zofran as needed for nausea and vomiting.  Accu-Chek and sliding scale.   TIME SPENT IN EVALUATING THIS PATIENT AND REVIEWING MEDICAL RECORDS:  Took more than 55 minutes including discussion about her condition and the plan with her and also with her husband.      ____________________________ Clovis Pu. Lenore Manner, MD amd:ea D: 11/15/2012 04:25:44 ET T: 11/15/2012 06:03:57 ET JOB#: [id number]  cc: Clovis Pu. Lenore Manner, MD, <Dictator> Mike Craze Irven Coe MD ELECTRONICALLY SIGNED 11/16/2012 2:20

## 2015-02-07 NOTE — Consult Note (Signed)
PATIENT NAME:  Erin Tate, Erin Tate MR#:  [id number] DATE OF BIRTH:  04-Jan-1961  DATE OF CONSULTATION:  11/20/2012  REFERRING PHYSICIAN:   CONSULTING PHYSICIAN:  Jill Side, MD  PRIMARY CARE PHYSICIAN: Dr. Francoise Schaumann.   REASON FOR CONSULTATION: Melena.   HISTORY OF PRESENT ILLNESS: This is a 54 year old female with history of obesity, diabetes, status post gastric bypass surgery in 2000. The patient was seen by Dr. Vira Agar about a week ago when she presented with melena and hematemesis. Upper GI endoscopy was quite unremarkable except for a small ulcer in the small intestine, which was reported to be clean-based. The patient was discharged home. The next day, the patient started to have melena again. She has had about 5 or 6 bloody tarry bowel movements within the last 24 to 48 hours. She started feeling weak and dizzy. Se came in with a hemoglobin that was better than the discharge hemoglobin, although the hemoglobin has dropped about 2 grams and current hemoglobin is at around 8. No hematemesis this time. Denies any significant abdominal pain.   PAST MEDICAL HISTORY:  1.  History of duodenal ulcer in the past.  2.  History of gastritis. 3.  History of just a small ulcer in the small intestine noted on recent esophagogastroduodenoscopy. 4.  History of chronic iron deficiency anemia. 5.  Obesity. 6.  Diabetes. 7.  Hypothyroidism. 8.  GERD. 9.  Asthma.  10. Migraines.  11. Hypertension.  12. Hyperlipidemia.  13. Diabetic neuropathy.  14. Breast cancer. 15. Endometriosis.   PAST SURGICAL HISTORY:  1.  Cholecystectomy.  2.  Hysterectomy.  3.  Gastric bypass surgery. 4.  Left mastectomy.   ALLERGIES: None.   SOCIAL HISTORY: Smokes about a pack a day.   FAMILY HISTORY: Unremarkable.   HOME MEDICATIONS: Include:  1.  Synthroid.  2.  Multivitamin.  3.  Cymbalta. [place]. 5.  Vitamin D. 6.  Invokana. 7.  Sucralfate.  8.  Protonix.   REVIEW OF SYSTEMS: Grossly negative  except for what is mentioned in the history of present illness.   PHYSICAL EXAMINATION:  GENERAL: Obese female, does not appear to be in any acute distress. Clinically appears to be pale. No jaundice was noted.  VITAL SIGNS: Temperature 98.3, pulse 64, respirations 18 to 20, blood pressure 105/65. LUNGS: Clear to auscultation bilaterally with fair air entry and no added sounds.  CARDIOVASCULAR: Regular rate and rhythm.  ABDOMEN: A rather soft abdomen, which is nontender and nondistended. No rebound or guarding was noted. No hepatosplenomegaly.  NEUROLOGIC: Examination appears to be grossly unremarkable.   LABORATORIES: Most recent hemoglobin is 8, hematocrit 24.2, platelet count of 329. White cell count 5.2. INR is within normal limits. BUN is low at 5. Creatinine 0.67. Electrolytes are unremarkable. Nuclear medicine bleeding scan was negative. Abdominal x-rays were unremarkable as well.   ASSESSMENT AND PLAN: The patient with recurrent melena, hematemesis as well as drop in her hemoglobin and hematocrit. Prior upper gastrointestinal endoscopy did not show a clear source of bleeding, although the patient is now again presenting with significant melena and further drop in her hemoglobin and hematocrit raising concerns about possible upper gastrointestinal lesions. I agree with intravenous Protonix, keep the patient n.p.o. and we will proceed with an upper gastrointestinal endoscopy today. The procedure was again discussed with the patient and her husband in detail and they are in full agreement. Further recommendations to follow.     ____________________________ Jill Side, MD si:aw D: 11/21/2012 11:03:44 ET T: 11/21/2012  74:25:95 ET JOB#: [id number]  cc: Jill Side, MD, <Dictator> Jill Side MD ELECTRONICALLY SIGNED 11/24/2012 12:57

## 2015-02-07 NOTE — Discharge Summary (Signed)
PATIENT NAME:  Erin Tate, Erin Tate MR#:  [id number] DATE OF BIRTH:  July 15, 1961  DATE OF ADMISSION:  11/20/2012 DATE OF DISCHARGE:  11/23/2012  ADMITTING DIAGNOSIS: Dark, tarry stools.   DISCHARGE DIAGNOSES:  1. Dark-tarry stools possibly related to old blood from recent hospitalization, status post repeat esophagogastroduodenoscopy shows no acute evidence of any bleeding, status post transfusion due to generalized weakness and fatigue.  2. A history of duodenal ulcer in the past.  3. A recent admission for hematemesis with esophagogastroduodenoscopy showing a nonbleeding small bowel ulcer.  4. A history of gastritis.  5. Iron deficiency anemia status post iron infusion during previous hospitalization.  6. Status post gastric bypass 2010.  7. A history of duodenal ulcer per esophagogastroduodenoscopy 2011.  8. Diabetes type 2.  9. Hypothyroidism.  10. Gastroesophageal reflux disease.  11. Asthma.  12. A history of migraines.  13. Hypertension.  14. Hyperlipidemia.  15. Fatty infiltration of the liver.  16. A history of diabetic neuropathy.  17. A history of breast cancer.  18. A history of endometriosis.  19. Status post cholecystectomy.  20. Status post hysterectomy.  21. Status post carpal surgery.  PERTINENT LABORATORY AND EVALUATIONS: Admitting BMP: Glucose was 111, BUN 6, creatinine 0.66, sodium 144, potassium 3.5, chloride was 111, CO2 was 25. LFTs were normal. WBC count was 7.5, hemoglobin 10.3. INR was 0.9 and the lipase was 182 The patient's hemoglobin was 7.9 on February 6th. She was transfused 1 unit. A repeat EGD on February 4th showed an anastomotic ulcer with clear base. The areas of anastomosis appeared granular and hyperemic but no other discrete lesions noted. No blood or bleeding noted.   HOSPITAL COURSE: Please refer to history and physical done by me. The patient is a 54 year old white female with diabetes, obesity, who had underwent a gastric bypass in 2000 and then  subsequently had developed melena in 2011. At that time, she was noted to have a duodenal ulcer. The patient was recently taking naproxen over-the-counter for osteoarthritis, hospitalized on 01/29 with hematemesis, hypotension, tachycardia, at that time received 1 unit of packed RBCs. Had an EGD which showed a clean base anastomotic ulcer without any active bleeding. The patient was discharged home. She returned with a few episodes of tarry stool. The patient's hemoglobin was stable on admission. On discharge on February 1st, her hemoglobin was 8.5. When she returned back on February 3rd, it was 10.3; however, it did drop during the hospitalization to 7.9 and she received 1 unit of packed RBCs. She was seen in consultation by Dr. Dionne Milo who recommended repeating an EGD which she had. The patient also was symptomatic from her anemia, therefore, she was transfused. The patient has not had any further bowel movements with any blood over the last 48 hours. She is tolerating diet well and is stable for discharge. If she has recurrence of this, should consider capsule endoscopy.   DISCHARGE MEDICATIONS:  1. Glumetza 500, 2 tabs daily.  2. Vitamin D2 50,000 international units weekly.  3. Invokana 300 mg daily.  4. Sucralfate 10 mL before meals and at bedtime.  5. Protonix 40, 1 tab p.o. b.i.d.  6. Cymbalta 60 daily.  7. Synthroid 150 mcg daily.  8. Crestor 20 daily.  9. Multivitamin daily.  10. Acetaminophen/codeine 300/15, 1 tab p.o. every 8 hours p.r.n. headache.   DIET: Low sodium, carbohydrate controlled diet.   ACTIVITY: As tolerated.   DISCHARGE FOLLOWUP: Follow up with Dr. Francoise Schaumann in 1 to 2 weeks, Dr. Dionne Milo  in 2 to 4 weeks.   TIME SPENT: 35 minutes spent.    ____________________________ Lafonda Mosses. Posey Pronto, MD shp:jm D: 11/24/2012 08:05:10 ET T: 11/24/2012 10:36:34 ET JOB#: [id number]  cc: Kaseem Vastine H. Posey Pronto, MD, <Dictator> Alric Seton MD ELECTRONICALLY SIGNED 11/28/2012 10:16

## 2015-02-07 NOTE — Consult Note (Signed)
Chief Complaint:   Subjective/Chief Complaint No BM. Hgb is almost the same. Appears pale and feels weak.  Will advance to soft diet. Transfusing another unit prior to discharge will probably help as she is symptomatic and amy become severly symptomatic in case of rebleeding. Will leave it up to primary team. Will check H. pylori antibody.   Electronic Signatures: Jill Side (MD)  (Signed 06-Feb-14 11:32)  Authored: Chief Complaint   Last Updated: 06-Feb-14 11:32 by Jill Side (MD)

## 2015-02-07 NOTE — Consult Note (Signed)
Pt CC: GI bleeding.  She feels better after transfusion, hgb up to 8.5, she also got some iron iv before the transfusion.  She should take some time from work and I told her I would do the FMLA papers for her next week and the hospitalist will write a note for the work place.  Should be able to go home today.  Electronic Signatures: Manya Silvas (MD)  (Signed on 01-Feb-14 12:41)  Authored  Last Updated: 01-Feb-14 12:41 by Manya Silvas (MD)

## 2015-02-07 NOTE — Consult Note (Signed)
CC: GI bleeding.  Pt was weak and had SOB with minimal effort.  Totally agree with transfusion given her symptoms.  She definitely feels stronger after the unit.  Iron sucrose iv  would be good for her.  Can get that as inpt or out pt at cancer center.  Given her surgery she may not absorb oral iron well.  Should go home on carafate slurry and PPI.  Electronic Signatures: Manya Silvas (MD)  (Signed on 31-Jan-14 17:35)  Authored  Last Updated: 31-Jan-14 17:35 by Manya Silvas (MD)

## 2015-02-07 NOTE — Consult Note (Signed)
Chief Complaint:   Subjective/Chief Complaint Feels weak. One dark BM last night. H and H unchanged at 8.0.  Recommendations: Continue to follow H and H. Full liquid diet. Continue Protonix. Will follow.   Electronic Signatures: Jill Side (MD)  (Signed 05-Feb-14 14:04)  Authored: Chief Complaint   Last Updated: 05-Feb-14 14:04 by Jill Side (MD)

## 2015-02-07 NOTE — Consult Note (Signed)
CC: UGI bleed from NSAID use, previous gastric bypass.  Hgb fell from 13 to 9 in 7 hours.  Plan EGD today for evaluation and possible endoscopic therapy.  Electronic Signatures: Manya Silvas (MD)  (Signed on 29-Jan-14 09:15)  Authored  Last Updated: 29-Jan-14 09:15 by Manya Silvas (MD)

## 2015-02-07 NOTE — Consult Note (Signed)
Chief Complaint:   Subjective/Chief Complaint EGD showed a clean based anatomotic ulcer without stigmata. No blood or bleeding noted.  Recommendations: Full liquid diet. Continue PPI and Carafate. Observe for at least 24 - 48 hours. Will follow.   Electronic Signatures: Jill Side (MD)  (Signed 04-Feb-14 13:58)  Authored: Chief Complaint   Last Updated: 04-Feb-14 13:58 by Jill Side (MD)

## 2015-03-12 ENCOUNTER — Ambulatory Visit: Payer: 59 | Admitting: General Surgery

## 2015-04-09 ENCOUNTER — Encounter: Payer: Self-pay | Admitting: *Deleted

## 2015-07-10 ENCOUNTER — Other Ambulatory Visit: Payer: Self-pay

## 2015-07-10 DIAGNOSIS — Z1231 Encounter for screening mammogram for malignant neoplasm of breast: Secondary | ICD-10-CM

## 2015-08-11 ENCOUNTER — Other Ambulatory Visit: Payer: Self-pay | Admitting: General Surgery

## 2015-08-11 ENCOUNTER — Ambulatory Visit
Admission: RE | Admit: 2015-08-11 | Discharge: 2015-08-11 | Disposition: A | Discharge status: Home / Self Care | Payer: 59 | Source: Ambulatory Visit | Attending: General Surgery | Admitting: General Surgery

## 2015-08-11 DIAGNOSIS — Z1231 Encounter for screening mammogram for malignant neoplasm of breast: Secondary | ICD-10-CM | POA: Diagnosis not present

## 2015-08-13 ENCOUNTER — Ambulatory Visit: Payer: Self-pay

## 2015-08-20 ENCOUNTER — Encounter: Payer: Self-pay | Admitting: General Surgery

## 2015-08-20 ENCOUNTER — Ambulatory Visit (INDEPENDENT_AMBULATORY_CARE_PROVIDER_SITE_OTHER): Payer: 59 | Admitting: General Surgery

## 2015-08-20 VITALS — BP 120/68 | HR 76 | Resp 12 | Ht 67.0 in | Wt 178.0 lb

## 2015-08-20 DIAGNOSIS — T85898A Other specified complication of other internal prosthetic devices, implants and grafts, initial encounter: Secondary | ICD-10-CM

## 2015-08-20 DIAGNOSIS — Z1211 Encounter for screening for malignant neoplasm of colon: Secondary | ICD-10-CM | POA: Diagnosis not present

## 2015-08-20 DIAGNOSIS — Z853 Personal history of malignant neoplasm of breast: Secondary | ICD-10-CM | POA: Diagnosis not present

## 2015-08-20 DIAGNOSIS — K289 Gastrojejunal ulcer, unspecified as acute or chronic, without hemorrhage or perforation: Secondary | ICD-10-CM | POA: Diagnosis not present

## 2015-08-20 MED ORDER — POLYETHYLENE GLYCOL 3350 17 GM/SCOOP PO POWD: ORAL | Status: DC

## 2015-08-20 NOTE — Progress Notes (Signed)
Patient ID: Erin Tate, female   DOB: 19-Nov-1960, 54 y.o.   MRN: [id number]  Chief Complaint  Patient presents with  . Follow-up    mammogram    HPI Erin Tate is a 54 y.o. female.  who presents for her follow up breast cancer and breast evaluation. The most recent mammogram was done on 08-13-15.  Patient does perform regular self breast checks and gets regular mammograms done.  No new breast issues. She states she is still having upper abdominal pain that comes and goes. Denies any further bloody emesis or melanotic stools. She did have a follow-up with Dr Margart Sickles at [facility] back February 2016. No additional treatments or management instructions for marginal ulceration received. She has noticed umbilical pain with lifting. I personally reviewed the above history with the patient. HPI  Past Medical History  Diagnosis Date  . Heart murmur   . Thyroid condition 1991  . Diabetes mellitus without complication ([place])   . High cholesterol   . Upper GI bleed 2014  . 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 Carpal tunnel release Bilateral 2007,2011  . Gastric bypass  2010  . Upper gi endoscopy  2010, 2014, 2015, 2016  . Hemorrhoid surgery  1983  . Tonsillectomy  1984  . 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 Abdominal hysterectomy  1997  . Colonoscopy  2006    Dr. Bary Castilla, hyperplastic polyp of the rectum. Diverticulosis.  . Knee surgery Right 2011  . Neck surgery  04/2013  . Subclavical osteoarthritis removal    . Breast biopsy Right 2003    neg    Family History  Problem Relation Age of Onset  . Lung cancer Mother   . Hodgkin's lymphoma Father     Social History Social History  Substance Use Topics  . Smoking status: Former Smoker -- 1.00 packs/day for 15 years  . Smokeless tobacco: Never Used  . Alcohol Use: No    No Known Allergies  Current Outpatient Prescriptions  Medication Sig Dispense Refill   . Canagliflozin (INVOKANA) 300 MG TABS Take 300 mg by mouth daily.    . DULoxetine (CYMBALTA) 60 MG capsule Take 60 mg by mouth daily.    . ergocalciferol (VITAMIN D2) 50000 UNITS capsule Take 50,000 Units by mouth once a week.    . levothyroxine (SYNTHROID, LEVOTHROID) 150 MCG tablet Take 150 mcg by mouth daily.    . metFORMIN (GLUMETZA) 500 MG (MOD) 24 hr tablet Take 500 mg by mouth daily with breakfast.    . Multiple Vitamin (MULTIVITAMIN) tablet Take 1 tablet by mouth daily.    Marland Kitchen omeprazole (PRILOSEC) 40 MG capsule Take 40 mg by mouth daily.    . rosuvastatin (CRESTOR) 20 MG tablet Take 20 mg by mouth daily.    . polyethylene glycol powder (GLYCOLAX/MIRALAX) powder 255 grams one bottle for colonoscopy prep 255 g 0   No current facility-administered medications for this visit.    Review of Systems Review of Systems  Constitutional: Negative.   Respiratory: Negative.   Cardiovascular: Negative.     Blood pressure 120/68, pulse 76, resp. rate 12, height 5\' 7"  (1.702 m), weight 178 lb (80.74 kg).  Physical Exam Physical Exam  Constitutional: She is oriented to person, place, and time. She appears well-developed and well-nourished.  HENT:  Mouth/Throat: Oropharynx is clear and moist.  Eyes: Conjunctivae are normal. No scleral icterus.  Neck:  Neck supple.  Mild asymmetry left neck.   Cardiovascular: Normal rate and regular rhythm.   Murmur heard.  Systolic murmur is present with a grade of 2/6  Pulmonary/Chest: Effort normal and breath sounds normal. Right breast exhibits no inverted nipple, no mass, no nipple discharge, no skin change and no tenderness.  Left mastectomy site well healed.  Abdominal: Normal appearance. There is tenderness. A hernia is present.  Small umbilical hernia with tenderness.  Lymphadenopathy:    She has no cervical adenopathy.    She has no axillary adenopathy.  Neurological: She is alert and oriented to person, place, and time.  Skin: Skin is warm  and dry.  Psychiatric: Her behavior is normal.    Data Reviewed 2006 colonoscopy is unremarkable.  Right breast mammogram dated 08/11/2015 was unremarkable. BI-RADS-1.  January 2016 upper endoscopy showed erythematous mucosa without active bleeding at the jejunal anastomosis. Improved from November 2015 exam.  Assessment    Benign breast exam.  Candidate for screening colonoscopy.  Candidate for follow-up upper endoscopy to reassess anastomotic ulcer.    Plan        The patient has been asked to return to the office in one year with a unilateral right breast screening mammogram.  Upper endoscopy and colonoscopy with possible biopsy/polypectomy prn: Information regarding the procedure, including its potential risks and complications (including but not limited to perforation of the bowel, which may require emergency surgery to repair, and bleeding) was verbally given to the patient. Educational information regarding lower intestinal endoscopy was given to the patient. Written instructions for how to complete the bowel prep using Miralax were provided. The importance of drinking ample fluids to avoid dehydration as a result of the prep emphasized.  Patient has been scheduled for a colonoscopy on 10-08-15 at [facility]. This patient has been asked to hold metformin day of colonoscopy prep and procedure. It is okay for patient to continue Invokana day of colonoscopy prep.   The patient brought up the possibility of left breast reconstruction. Pros and cons of intervention were reviewed. The patient was encouraged to meet with the plastic surgery service further formal assessment. This be placed on hold at present. She can notify the office if referral is desired.  PCP:  Teodoro Kil 08/21/2015, 7:20 AM

## 2015-08-20 NOTE — Patient Instructions (Addendum)
The patient is aware to call back for any questions or concerns. The patient has been asked to return to the office in one year with a unilateral right breast screening mammogramColonoscopy A colonoscopy is an exam to look at the entire large intestine (colon). This exam can help find problems such as tumors, polyps, inflammation, and areas of bleeding. The exam takes about 1 hour.  LET [facility] CARE PROVIDER KNOW ABOUT:   Any allergies you have.  All medicines you are taking, including vitamins, herbs, eye drops, creams, and over-the-counter medicines.  Previous problems you or members of your family have had with the use of anesthetics.  Any blood disorders you have.  Previous surgeries you have had.  Medical conditions you have. RISKS AND COMPLICATIONS  Generally, this is a safe procedure. However, as with any procedure, complications can occur. Possible complications include:  Bleeding.  Tearing or rupture of the colon wall.  Reaction to medicines given during the exam.  Infection (rare). BEFORE THE PROCEDURE   Ask your health care provider about changing or stopping your regular medicines.  You may be prescribed an oral bowel prep. This involves drinking a large amount of medicated liquid, starting the day before your procedure. The liquid will cause you to have multiple loose stools until your stool is almost clear or light green. This cleans out your colon in preparation for the procedure.  Do not eat or drink anything else once you have started the bowel prep, unless your health care provider tells you it is safe to do so.  Arrange for someone to drive you home after the procedure. PROCEDURE   You will be given medicine to help you relax (sedative).  You will lie on your side with your knees bent.  A long, flexible tube with a light and camera on the end (colonoscope) will be inserted through the rectum and into the colon. The camera sends video back to a computer  screen as it moves through the colon. The colonoscope also releases carbon dioxide gas to inflate the colon. This helps your health care provider see the area better.  During the exam, your health care provider may take a small tissue sample (biopsy) to be examined under a microscope if any abnormalities are found.  The exam is finished when the entire colon has been viewed. AFTER THE PROCEDURE   Do not drive for 24 hours after the exam.  You may have a small amount of blood in your stool.  You may pass moderate amounts of gas and have mild abdominal cramping or bloating. This is caused by the gas used to inflate your colon during the exam.  Ask when your test results will be ready and how you will get your results. Make sure you get your test results.   This information is not intended to replace advice given to you by your health care provider. Make sure you discuss any questions you have with your health care provider.   Document Released: 10/01/2000 Document Revised: 07/25/2013 Document Reviewed: 06/11/2013 Elsevier Interactive Patient Education [employer].   Patient has been scheduled for a colonoscopy on 10-08-15 at [facility]. This patient has been asked to hold metformin day of colonoscopy prep and procedure. It is okay for patient to continue Invokana day of colonoscopy prep.

## 2015-08-21 DIAGNOSIS — K289 Gastrojejunal ulcer, unspecified as acute or chronic, without hemorrhage or perforation: Secondary | ICD-10-CM | POA: Insufficient documentation

## 2015-08-21 DIAGNOSIS — Z1211 Encounter for screening for malignant neoplasm of colon: Secondary | ICD-10-CM | POA: Insufficient documentation

## 2015-08-21 NOTE — H&P (Signed)
Patient ID: Erin Tate, female DOB: 1960/10/31, 54 y.o. MRN: [id number]  Chief Complaint   Patient presents with   .  Follow-up     mammogram    HPI  Erin Tate is a 54 y.o. female. who presents for her follow up breast cancer and breast evaluation. The most recent mammogram was done on 08-13-15.  Patient does perform regular self breast checks and gets regular mammograms done. No new breast issues.  She states she is still having upper abdominal pain that comes and goes. Denies any further bloody emesis or melanotic stools. She did have a follow-up with Dr Margart Sickles at [facility] back February 2016. No additional treatments or management instructions for marginal ulceration received.  She has noticed umbilical pain with lifting.  I personally reviewed the above history with the patient.  HPI  Past Medical History   Diagnosis  Date   .  Heart murmur    .  Thyroid condition  1991   .  Diabetes mellitus without complication ([place])    .  High cholesterol    .  Upper GI bleed  2014   .  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  Carpal tunnel release  Bilateral  2007,2011   .  Gastric bypass   2010   .  Upper gi endoscopy   2010, 2014, 2015, 2016   .  Hemorrhoid surgery   1983   .  Tonsillectomy   1984   .  Mastectomy  Left  1996     DCIS   .  Abdominal hysterectomy   1997   .  Colonoscopy   2006     Dr. Bary Castilla, hyperplastic polyp of the rectum. Diverticulosis.   .  Knee surgery  Right  2011   .  Neck surgery   04/2013   .  Subclavical osteoarthritis removal     .  Breast biopsy  Right  2003     neg    Family History   Problem  Relation  Age of Onset   .  Lung cancer  Mother    .  Hodgkin's lymphoma  Father     Social History  Social History   Substance Use Topics   .  Smoking status:  Former Smoker -- 1.00 packs/day for 15 years   .  Smokeless tobacco:  Never Used   .  Alcohol Use:  No    No Known  Allergies  Current Outpatient Prescriptions   Medication  Sig  Dispense  Refill   .  Canagliflozin (INVOKANA) 300 MG TABS  Take 300 mg by mouth daily.     .  DULoxetine (CYMBALTA) 60 MG capsule  Take 60 mg by mouth daily.     .  ergocalciferol (VITAMIN D2) 50000 UNITS capsule  Take 50,000 Units by mouth once a week.     .  levothyroxine (SYNTHROID, LEVOTHROID) 150 MCG tablet  Take 150 mcg by mouth daily.     .  metFORMIN (GLUMETZA) 500 MG (MOD) 24 hr tablet  Take 500 mg by mouth daily with breakfast.     .  Multiple Vitamin (MULTIVITAMIN) tablet  Take 1 tablet by mouth daily.     Marland Kitchen  omeprazole (PRILOSEC) 40 MG capsule  Take 40 mg by mouth daily.     .  rosuvastatin (CRESTOR) 20 MG tablet  Take  20 mg by mouth daily.     .  polyethylene glycol powder (GLYCOLAX/MIRALAX) powder  255 grams one bottle for colonoscopy prep  255 g  0    No current facility-administered medications for this visit.    Review of Systems  Review of Systems  Constitutional: Negative.  Respiratory: Negative.  Cardiovascular: Negative.   Blood pressure 120/68, pulse 76, resp. rate 12, height 5\' 7"  (1.702 m), weight 178 lb (80.74 kg).  Physical Exam  Physical Exam  Constitutional: She is oriented to person, place, and time. She appears well-developed and well-nourished.  HENT:  Mouth/Throat: Oropharynx is clear and moist.  Eyes: Conjunctivae are normal. No scleral icterus.  Neck: Neck supple.  Mild asymmetry left neck.  Cardiovascular: Normal rate and regular rhythm.  Murmur heard.  Systolic murmur is present with a grade of 2/6  Pulmonary/Chest: Effort normal and breath sounds normal. Right breast exhibits no inverted nipple, no mass, no nipple discharge, no skin change and no tenderness.  Left mastectomy site well healed.  Abdominal: Normal appearance. There is tenderness. A hernia is present.  Small umbilical hernia with tenderness.  Lymphadenopathy:  She has no cervical adenopathy.  She has no axillary  adenopathy.  Neurological: She is alert and oriented to person, place, and time.  Skin: Skin is warm and dry.  Psychiatric: Her behavior is normal.   Data Reviewed  2006 colonoscopy is unremarkable.  Right breast mammogram dated 08/11/2015 was unremarkable. BI-RADS-1.  January 2016 upper endoscopy showed erythematous mucosa without active bleeding at the jejunal anastomosis. Improved from November 2015 exam.  Assessment   Benign breast exam.  Candidate for screening colonoscopy.  Candidate for follow-up upper endoscopy to reassess anastomotic ulcer.   Plan    The patient has been asked to return to the office in one year with a unilateral right breast screening mammogram.  Upper endoscopy and colonoscopy with possible biopsy/polypectomy prn: Information regarding the procedure, including its potential risks and complications (including but not limited to perforation of the bowel, which may require emergency surgery to repair, and bleeding) was verbally given to the patient. Educational information regarding lower intestinal endoscopy was given to the patient. Written instructions for how to complete the bowel prep using Miralax were provided. The importance of drinking ample fluids to avoid dehydration as a result of the prep emphasized.  Patient has been scheduled for a colonoscopy on 10-08-15 at [facility]. This patient has been asked to hold metformin day of colonoscopy prep and procedure. It is okay for patient to continue Invokana day of colonoscopy prep.  The patient brought up the possibility of left breast reconstruction. Pros and cons of intervention were reviewed. The patient was encouraged to meet with the plastic surgery service further formal assessment. This be placed on hold at present. She can notify the office if referral is desired.  PCP: Teodoro Kil  08/21/2015, 7:20 AM

## 2015-10-01 ENCOUNTER — Telehealth: Payer: Self-pay | Admitting: *Deleted

## 2015-10-01 NOTE — Telephone Encounter (Signed)
Patient confirms no medication changes since last office visit. This patient has yet to pick up Miralax prescription but reminded to do so.   We will proceed with upper endoscopy and colonoscopy as scheduled for 10-08-15 at [facility].   Patient instructed to call the office should she have further questions.

## 2015-10-08 ENCOUNTER — Ambulatory Visit: Payer: 59 | Admitting: Anesthesiology

## 2015-10-08 ENCOUNTER — Encounter: Payer: Self-pay | Admitting: Anesthesiology

## 2015-10-08 ENCOUNTER — Encounter
Admission: RE | Disposition: A | Discharge status: Home / Self Care | Payer: Self-pay | Source: Ambulatory Visit | Attending: General Surgery

## 2015-10-08 ENCOUNTER — Ambulatory Visit
Admission: RE | Admit: 2015-10-08 | Discharge: 2015-10-08 | Disposition: A | Discharge status: Home / Self Care | Payer: 59 | Source: Ambulatory Visit | Attending: General Surgery | Admitting: General Surgery

## 2015-10-08 DIAGNOSIS — Z90710 Acquired absence of both cervix and uterus: Secondary | ICD-10-CM | POA: Diagnosis not present

## 2015-10-08 DIAGNOSIS — Z9012 Acquired absence of left breast and nipple: Secondary | ICD-10-CM | POA: Insufficient documentation

## 2015-10-08 DIAGNOSIS — F17200 Nicotine dependence, unspecified, uncomplicated: Secondary | ICD-10-CM | POA: Diagnosis not present

## 2015-10-08 DIAGNOSIS — K289 Gastrojejunal ulcer, unspecified as acute or chronic, without hemorrhage or perforation: Secondary | ICD-10-CM | POA: Diagnosis not present

## 2015-10-08 DIAGNOSIS — E119 Type 2 diabetes mellitus without complications: Secondary | ICD-10-CM | POA: Insufficient documentation

## 2015-10-08 DIAGNOSIS — D649 Anemia, unspecified: Secondary | ICD-10-CM | POA: Insufficient documentation

## 2015-10-08 DIAGNOSIS — Z1211 Encounter for screening for malignant neoplasm of colon: Secondary | ICD-10-CM | POA: Diagnosis not present

## 2015-10-08 DIAGNOSIS — Z79899 Other long term (current) drug therapy: Secondary | ICD-10-CM | POA: Diagnosis not present

## 2015-10-08 DIAGNOSIS — Z7984 Long term (current) use of oral hypoglycemic drugs: Secondary | ICD-10-CM | POA: Insufficient documentation

## 2015-10-08 DIAGNOSIS — Z9884 Bariatric surgery status: Secondary | ICD-10-CM | POA: Insufficient documentation

## 2015-10-08 DIAGNOSIS — E039 Hypothyroidism, unspecified: Secondary | ICD-10-CM | POA: Diagnosis not present

## 2015-10-08 DIAGNOSIS — M1990 Unspecified osteoarthritis, unspecified site: Secondary | ICD-10-CM | POA: Diagnosis not present

## 2015-10-08 DIAGNOSIS — Z853 Personal history of malignant neoplasm of breast: Secondary | ICD-10-CM | POA: Diagnosis not present

## 2015-10-08 HISTORY — DX: Headache: R51

## 2015-10-08 HISTORY — DX: Hypothyroidism, unspecified: E03.9

## 2015-10-08 HISTORY — DX: Headache, unspecified: R51.9

## 2015-10-08 HISTORY — DX: Pneumonia, unspecified organism: J18.9

## 2015-10-08 HISTORY — PX: COLONOSCOPY WITH PROPOFOL: SHX5780

## 2015-10-08 HISTORY — PX: ESOPHAGOGASTRODUODENOSCOPY (EGD) WITH PROPOFOL: SHX5813

## 2015-10-08 LAB — GLUCOSE, CAPILLARY: GLUCOSE-CAPILLARY: 116 mg/dL — AB (ref 65–99)

## 2015-10-08 SURGERY — COLONOSCOPY WITH PROPOFOL
Anesthesia: General

## 2015-10-08 MED ORDER — PROPOFOL 500 MG/50ML IV EMUL
INTRAVENOUS | Status: DC | PRN
  Administered 2015-10-08: 100 ug/kg/min via INTRAVENOUS

## 2015-10-08 MED ORDER — SODIUM CHLORIDE 0.9 % IV SOLN
INTRAVENOUS | Status: DC
  Administered 2015-10-08: 11:00:00 via INTRAVENOUS
  Administered 2015-10-08: 1000 mL via INTRAVENOUS

## 2015-10-08 MED ORDER — FENTANYL CITRATE (PF) 100 MCG/2ML IJ SOLN
INTRAMUSCULAR | Status: DC | PRN
  Administered 2015-10-08: 50 ug via INTRAVENOUS

## 2015-10-08 MED ORDER — MIDAZOLAM HCL 2 MG/2ML IJ SOLN
INTRAMUSCULAR | Status: DC | PRN
  Administered 2015-10-08: 1 mg via INTRAVENOUS

## 2015-10-08 NOTE — Anesthesia Preprocedure Evaluation (Addendum)
Anesthesia Evaluation  Patient identified by MRN, date of birth, ID band Patient awake    Reviewed: Allergy & Precautions, H&P , NPO status , Patient's Chart, lab work & pertinent test results  History of Anesthesia Complications Negative for: history of anesthetic complications  Airway Mallampati: III  TM Distance: >3 FB Neck ROM: full    Dental no notable dental hx. (+) Teeth Intact   Pulmonary neg shortness of breath, pneumonia, resolved, Current Smoker,    Pulmonary exam normal breath sounds clear to auscultation       Cardiovascular Exercise Tolerance: Good (-) angina(-) Past MI and (-) DOE negative cardio ROS Normal cardiovascular exam Rhythm:regular Rate:Normal     Neuro/Psych  Headaches, negative psych ROS   GI/Hepatic Neg liver ROS, PUD,   Endo/Other  diabetesHypothyroidism   Renal/GU negative Renal ROS  negative genitourinary   Musculoskeletal  (+) Arthritis ,   Abdominal   Peds  Hematology negative hematology ROS (+)   Anesthesia Other Findings Past Medical History:   Heart murmur                                                 Thyroid condition                               1991         Diabetes mellitus without complication ([place])                 High cholesterol                                             Upper GI bleed                                  2014         Cancer ([place])                                    1996           Comment:LEFT MASTECTOMY DCIS   Pneumonia                                                    Arthritis                                                    Headache                                                     Anemia  Hypothyroidism                                              Past Surgical History:   CARPAL TUNNEL RELEASE                           Bilateral [id number]    GASTRIC BYPASS                                    2010         UPPER GI ENDOSCOPY                               2010, [place]         MASTECTOMY                                      Left 1996           Comment:DCIS   ABDOMINAL HYSTERECTOMY                           1997         COLONOSCOPY                                      2006           Comment:Dr. Byrnett, hyperplastic polyp of the rectum.               Diverticulosis.   KNEE SURGERY                                    Right 2011         NECK SURGERY                                     04/2013      subclavical osteoarthritis removal                            BREAST BIOPSY                                   Right 2003           Comment:neg  BMI    Body Mass Index   26.61 kg/m 2  Reproductive/Obstetrics negative OB ROS                            Anesthesia Physical Anesthesia Plan  ASA: III  Anesthesia Plan: General   Post-op Pain Management:    Induction:   Airway Management Planned:   Additional Equipment:   Intra-op Plan:   Post-operative Plan:   Informed Consent: I have reviewed the patients History and Physical, chart, labs and discussed the procedure including the risks, benefits and alternatives for the proposed anesthesia with the patient or authorized representative who has indicated his/her understanding and acceptance.   Dental Advisory Given  Plan Discussed with: Anesthesiologist, CRNA and Surgeon  Anesthesia Plan Comments:         Anesthesia Quick Evaluation

## 2015-10-08 NOTE — Transfer of Care (Signed)
Immediate Anesthesia Transfer of Care Note  Patient: Erin Tate  Procedure(s) Performed: Procedure(s): COLONOSCOPY WITH PROPOFOL (N/A) ESOPHAGOGASTRODUODENOSCOPY (EGD) WITH PROPOFOL (N/A)  Patient Location: PACU  Anesthesia Type:General  Level of Consciousness: awake, alert  and oriented  Airway & Oxygen Therapy: Patient Spontanous Breathing  Post-op Assessment: Report given to RN  Post vital signs: stable  Last Vitals:  Filed Vitals:   10/08/15 0938 10/08/15 1122  BP: 129/82 136/63  Pulse: 70 68  Temp: 36.3 C 36.4 C  Resp:  16    Complications: No apparent anesthesia complications

## 2015-10-08 NOTE — Anesthesia Procedure Notes (Signed)
Performed by: Avilene Marrin Oxygen Delivery Method: Nasal cannula     

## 2015-10-08 NOTE — H&P (Signed)
Nikky Leisinger [id number] [birth year]     HPI: Candidate for screening colonoscopy, upper endoscopy to assess anastomotic ulcer.   Prescriptions prior to admission  Medication Sig Dispense Refill Last Dose  . Canagliflozin (INVOKANA) 300 MG TABS Take 300 mg by mouth daily.   Taking  . DULoxetine (CYMBALTA) 60 MG capsule Take 60 mg by mouth daily.   Taking  . ergocalciferol (VITAMIN D2) 50000 UNITS capsule Take 50,000 Units by mouth once a week.   Taking  . levothyroxine (SYNTHROID, LEVOTHROID) 150 MCG tablet Take 150 mcg by mouth daily.   Taking  . metFORMIN (GLUMETZA) 500 MG (MOD) 24 hr tablet Take 500 mg by mouth daily with breakfast.   Taking  . Multiple Vitamin (MULTIVITAMIN) tablet Take 1 tablet by mouth daily.   Taking  . omeprazole (PRILOSEC) 40 MG capsule Take 40 mg by mouth daily.     . polyethylene glycol powder (GLYCOLAX/MIRALAX) powder 255 grams one bottle for colonoscopy prep 255 g 0   . rosuvastatin (CRESTOR) 20 MG tablet Take 20 mg by mouth daily.   Taking   No Known Allergies Past Medical History  Diagnosis Date  . Heart murmur   . Thyroid condition 1991  . Diabetes mellitus without complication ([place])   . High cholesterol   . Upper GI bleed 2014  . Cancer ([place]) 1996    LEFT MASTECTOMY DCIS  . Pneumonia   . Arthritis   . Headache   . Anemia   . Hypothyroidism    Past Surgical History  Procedure Laterality Date  . Carpal tunnel release Bilateral 2007,2011  . Gastric bypass  2010  . Upper gi endoscopy  2010, 2014, 2015, 2016  . Hemorrhoid surgery  1983  . Tonsillectomy  1984  . 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 Abdominal hysterectomy  1997  . Colonoscopy  2006    Dr. Bary Castilla, hyperplastic polyp of the rectum. Diverticulosis.  . Knee surgery Right 2011  . Neck surgery  04/2013  . Subclavical osteoarthritis removal    . Breast biopsy Right 2003    neg   Social History   Social History  . Marital Status: Single    Spouse Name: N/A  . Number of  Children: N/A  . Years of Education: N/A   Occupational History  . Not on file.   Social History Main Topics  . Smoking status: Current Every Day Smoker -- 1.00 packs/day for 15 years  . Smokeless tobacco: Never Used  . Alcohol Use: No  . Drug Use: No  . Sexual Activity: Not on file   Other Topics Concern  . Not on file   Social History Narrative   Social History   Social History Narrative     ROS: Negative.     PE: HEENT: Negative. Lungs: Clear. Cardio: RR. Robert Bellow 10/08/2015   Assessment/Plan:  Proceed with planned upper and lower endoscopy endoscopy.

## 2015-10-08 NOTE — Op Note (Signed)
[facility] Patient Name: Erin Tate Procedure Date: 10/08/2015 10:39 AM MRN: [id number] Account #: [id number] Date of Birth: 10/21/1960 Admit Type: Outpatient Age: 54 Room: [facility] ENDO ROOM 1 Gender: Female Note Status: Finalized Procedure:         Colonoscopy Indications:       Screening for colorectal malignant neoplasm Providers:         Robert Bellow, MD Referring MD:      Lenard Simmer, MD (Referring MD) Medicines:         Monitored Anesthesia Care Complications:     No immediate complications. Procedure:         Pre-Anesthesia Assessment:                    - Prior to the procedure, a History and Physical was                     performed, and patient medications, allergies and                     sensitivities were reviewed. The patient's tolerance of                     previous anesthesia was reviewed.                    - The risks and benefits of the procedure and the sedation                     options and risks were discussed with the patient. All                     questions were answered and informed consent was obtained.                    After obtaining informed consent, the colonoscope was                     passed under direct vision. Throughout the procedure, the                     patient's blood pressure, pulse, and oxygen saturations                     were monitored continuously. The Colonoscope was                     introduced through the anus and advanced to the the cecum,                     identified by appendiceal orifice and ileocecal valve. The                     colonoscopy was somewhat difficult due to a tortuous                     colon. The patient tolerated the procedure well. The                     quality of the bowel preparation was excellent. Findings:      The entire examined colon appeared normal on direct and retroflexion       views. Impression:        - The entire examined  colon is normal on direct and  retroflexion views.                    - No specimens collected. Recommendation:    - Repeat colonoscopy in 10 years for screening purposes. Procedure Code(s): --- Professional ---                    [phone], Colonoscopy, flexible; diagnostic, including                     collection of specimen(s) by brushing or washing, when                     performed (separate procedure) Diagnosis Code(s): --- Professional ---                    Z12.11, Encounter for screening for malignant neoplasm of                     colon CPT copyright 2014 American Medical Association. All rights reserved. The codes documented in this report are preliminary and upon coder review may  be revised to meet current compliance requirements. Robert Bellow, MD 10/08/2015 11:21:24 AM This report has been signed electronically. Number of Addenda: 0 Note Initiated On: 10/08/2015 10:39 AM Scope Withdrawal Time: 0 hours 9 minutes 35 seconds  Total Procedure Duration: 0 hours 21 minutes 0 seconds       [facility]

## 2015-10-08 NOTE — Op Note (Signed)
[facility] Gastroenterology Patient Name: Erin Tate Procedure Date: 10/08/2015 10:40 AM MRN: [id number] Account #: [id number] Date of Birth: 01-01-1961 Admit Type: Outpatient Age: 54 Room: [facility] ENDO ROOM 1 Gender: Female Note Status: Finalized Procedure:         Upper GI endoscopy Indications:       Surveillance procedure Providers:         Robert Bellow, MD Referring MD:      Lenard Simmer, MD (Referring MD) Medicines:         Monitored Anesthesia Care Complications:     No immediate complications. Procedure:         Pre-Anesthesia Assessment:                    - Prior to the procedure, a History and Physical was                     performed, and patient medications, allergies and                     sensitivities were reviewed. The patient's tolerance of                     previous anesthesia was reviewed.                    - The risks and benefits of the procedure and the sedation                     options and risks were discussed with the patient. All                     questions were answered and informed consent was obtained.                    After obtaining informed consent, the endoscope was passed                     under direct vision. Throughout the procedure, the                     patient's blood pressure, pulse, and oxygen saturations                     were monitored continuously. The Endoscope was introduced                     through the mouth, and advanced to the jejunum. The upper                     GI endoscopy was accomplished without difficulty. The                     patient tolerated the procedure well. Findings:      The esophagus was normal. No evidence of recurrent anastomotic       ulceration.      The stomach was normal. Impression:        - Normal esophagus.                    - Normal stomach.                    - No specimens collected. Recommendation:    - Perform a colonoscopy  today. Procedure Code(s): --- Professional ---  [id number], Esophagogastroduodenoscopy, flexible, transoral;                     diagnostic, including collection of specimen(s) by                     brushing or washing, when performed (separate procedure) CPT copyright 2014 American Medical Association. All rights reserved. The codes documented in this report are preliminary and upon coder review may  be revised to meet current compliance requirements. Robert Bellow, MD 10/08/2015 10:55:24 AM This report has been signed electronically. Number of Addenda: 0 Note Initiated On: 10/08/2015 10:40 AM      [facility]

## 2015-10-09 ENCOUNTER — Encounter: Payer: Self-pay | Admitting: General Surgery

## 2015-10-09 NOTE — Anesthesia Postprocedure Evaluation (Signed)
Anesthesia Post Note  Patient: Erin Tate  Procedure(s) Performed: Procedure(s) (LRB): COLONOSCOPY WITH PROPOFOL (N/A) ESOPHAGOGASTRODUODENOSCOPY (EGD) WITH PROPOFOL (N/A)  Patient location during evaluation: Endoscopy Anesthesia Type: General Level of consciousness: awake and alert Pain management: pain level controlled Vital Signs Assessment: post-procedure vital signs reviewed and stable Respiratory status: spontaneous breathing, nonlabored ventilation, respiratory function stable and patient connected to nasal cannula oxygen Cardiovascular status: blood pressure returned to baseline and stable Postop Assessment: no signs of nausea or vomiting Anesthetic complications: no    Last Vitals:  Filed Vitals:   10/08/15 1140 10/08/15 1150  BP: 132/73 131/64  Pulse: 54 59  Temp:    Resp: 13 16    Last Pain: There were no vitals filed for this visit.               Precious Haws Greer [place]

## 2016-01-30 ENCOUNTER — Encounter: Payer: Self-pay | Admitting: *Deleted

## 2016-02-02 ENCOUNTER — Ambulatory Visit: Payer: 59

## 2016-02-02 ENCOUNTER — Inpatient Hospital Stay: Payer: 59 | Attending: Internal Medicine | Admitting: Internal Medicine

## 2016-02-02 ENCOUNTER — Encounter: Payer: Self-pay | Admitting: Internal Medicine

## 2016-02-02 VITALS — BP 128/77 | HR 83 | Temp 96.9°F | Resp 18 | Wt 183.9 lb

## 2016-02-02 DIAGNOSIS — E039 Hypothyroidism, unspecified: Secondary | ICD-10-CM | POA: Insufficient documentation

## 2016-02-02 DIAGNOSIS — Z853 Personal history of malignant neoplasm of breast: Secondary | ICD-10-CM | POA: Diagnosis not present

## 2016-02-02 DIAGNOSIS — Z9012 Acquired absence of left breast and nipple: Secondary | ICD-10-CM | POA: Insufficient documentation

## 2016-02-02 DIAGNOSIS — E119 Type 2 diabetes mellitus without complications: Secondary | ICD-10-CM | POA: Insufficient documentation

## 2016-02-02 DIAGNOSIS — E7800 Pure hypercholesterolemia, unspecified: Secondary | ICD-10-CM | POA: Insufficient documentation

## 2016-02-02 DIAGNOSIS — Z79899 Other long term (current) drug therapy: Secondary | ICD-10-CM | POA: Diagnosis not present

## 2016-02-02 DIAGNOSIS — F17210 Nicotine dependence, cigarettes, uncomplicated: Secondary | ICD-10-CM | POA: Diagnosis not present

## 2016-02-02 DIAGNOSIS — Z9884 Bariatric surgery status: Secondary | ICD-10-CM | POA: Diagnosis not present

## 2016-02-02 DIAGNOSIS — D509 Iron deficiency anemia, unspecified: Secondary | ICD-10-CM | POA: Diagnosis not present

## 2016-02-02 DIAGNOSIS — K909 Intestinal malabsorption, unspecified: Secondary | ICD-10-CM | POA: Insufficient documentation

## 2016-02-02 DIAGNOSIS — Z7984 Long term (current) use of oral hypoglycemic drugs: Secondary | ICD-10-CM | POA: Insufficient documentation

## 2016-02-02 DIAGNOSIS — D508 Other iron deficiency anemias: Secondary | ICD-10-CM

## 2016-02-02 LAB — CBC WITH DIFFERENTIAL/PLATELET
BASOS PCT: 1 %
Basophils Absolute: 0.1 10*3/uL (ref 0–0.1)
Eosinophils Absolute: 0.2 10*3/uL (ref 0–0.7)
Eosinophils Relative: 2 %
HEMATOCRIT: 34.2 % — AB (ref 35.0–47.0)
HEMOGLOBIN: 10.4 g/dL — AB (ref 12.0–16.0)
LYMPHS ABS: 1.6 10*3/uL (ref 1.0–3.6)
LYMPHS PCT: 20 %
MCH: 19.5 pg — AB (ref 26.0–34.0)
MCHC: 30.5 g/dL — AB (ref 32.0–36.0)
MCV: 63.8 fL — AB (ref 80.0–100.0)
Monocytes Absolute: 0.4 10*3/uL (ref 0.2–0.9)
Monocytes Relative: 6 %
NEUTROS ABS: 5.6 10*3/uL (ref 1.4–6.5)
NEUTROS PCT: 71 %
Platelets: 323 10*3/uL (ref 150–440)
RBC: 5.36 MIL/uL — ABNORMAL HIGH (ref 3.80–5.20)
RDW: 17.8 % — ABNORMAL HIGH (ref 11.5–14.5)
WBC: 7.9 10*3/uL (ref 3.6–11.0)

## 2016-02-02 LAB — VITAMIN B12: Vitamin B-12: 259 pg/mL (ref 180–914)

## 2016-02-02 LAB — FERRITIN: Ferritin: 2 ng/mL — ABNORMAL LOW (ref 11–307)

## 2016-02-02 LAB — IRON AND TIBC
IRON: 7 ug/dL — AB (ref 28–170)
Saturation Ratios: 1 % — ABNORMAL LOW (ref 10.4–31.8)
TIBC: 595 ug/dL — ABNORMAL HIGH (ref 250–450)
UIBC: 588 ug/dL

## 2016-02-02 LAB — FOLATE: Folate: 20.2 ng/mL (ref >5.9)

## 2016-02-02 NOTE — Progress Notes (Signed)
Patient OP of Dr. Cynda Acres.  Referred back by Dr. Ronnald Collum.  Patient states she stays tired.  Generally does not feel good.  Labs drawn on 01-12-16.  HGB 10.3  No iron levels drawn at that time.

## 2016-02-02 NOTE — Progress Notes (Signed)
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 Bary Castilla ; April 2017- IV ferrahem x2.   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 I reviewed the patient's prior charts/pertinent labs/imaging in detail; findings are summarized above.    55 year old female patient with above history of  Gastric bypass/ iron deficiency anemia secondary to malabsorption is here for follow-up.   patient complains of significant fatigue. Denies any chest pain. Denies swelling in the legs. Denies and blood in stools or black stools. She had a recent colonoscopy upper endoscopy in December 2016. No evidence of a gastric ulcers.  She does not take by mouth iron because of intolerance; Causes constipation and abdominal discomfort.   Last IV iron was  Approximately 2 years ago.  REVIEW OF SYSTEMS:  A complete 10 point review of system is done which is negative except mentioned above/history of present illness.   PAST MEDICAL HISTORY :  Past Medical History  Diagnosis Date  . Heart murmur   . Thyroid condition 1991  . Diabetes mellitus without complication ([place])   . High cholesterol   . Upper GI bleed 2014  . Breast cancer ([place]) 1996    LEFT MASTECTOMY DCIS  . Pneumonia   . Osteoarthritis     h/o Reclast injection  . Headache   . Hypothyroidism   . IDA (iron deficiency anemia)     PAST SURGICAL HISTORY :   Past Surgical History  Procedure Laterality Date  . Carpal tunnel release Bilateral 2007,2011  . Gastric bypass  2010  . Upper gi endoscopy  2010, 2014, 2015, 2016  . Hemorrhoid surgery   1983  . Tonsillectomy  1984  . 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 Abdominal hysterectomy  1997  . Colonoscopy  2006    Dr. Bary Castilla, hyperplastic polyp of the rectum. Diverticulosis.  . Knee surgery Right 2011  . Neck surgery  04/2013  . Subclavical osteoarthritis removal    . Breast biopsy Right 2003    neg  . Colonoscopy with propofol N/A 10/08/2015    Procedure: COLONOSCOPY WITH PROPOFOL;  Surgeon: Robert Bellow, MD;  Location: [facility] ENDOSCOPY;  Service: Endoscopy;  Laterality: N/A;  . Esophagogastroduodenoscopy (egd) with propofol N/A 10/08/2015    Procedure: ESOPHAGOGASTRODUODENOSCOPY (EGD) WITH PROPOFOL;  Surgeon: Robert Bellow, MD;  Location: [facility] ENDOSCOPY;  Service: Endoscopy;  Laterality: N/A;    FAMILY HISTORY :   Family History  Problem Relation Age of Onset  . Lung cancer Mother   . Hodgkin's lymphoma Father     SOCIAL HISTORY:   Social History  Substance Use Topics  . Smoking status: Current Every Day Smoker -- 1.00 packs/day for 15 years  . Smokeless tobacco: Never Used  . Alcohol Use: No    ALLERGIES:  has No Known Allergies.  MEDICATIONS:  Current Outpatient Prescriptions  Medication Sig Dispense Refill  . ALPRAZolam (XANAX) 0.25 MG tablet     . atorvastatin (LIPITOR) 20 MG tablet TK 1 T PO QD  0  . Canagliflozin (INVOKANA) 300 MG TABS Take 300 mg by  mouth daily.    . DULoxetine (CYMBALTA) 60 MG capsule Take 60 mg by mouth daily.    . ergocalciferol (VITAMIN D2) 50000 UNITS capsule Take 50,000 Units by mouth once a week.    . levothyroxine (SYNTHROID, LEVOTHROID) 150 MCG tablet Take 150 mcg by mouth daily.    . metFORMIN (GLUMETZA) 500 MG (MOD) 24 hr tablet Take 500 mg by mouth daily with breakfast.    . Multiple Vitamin (MULTIVITAMIN) tablet Take 1 tablet by mouth daily.    Marland Kitchen omeprazole (PRILOSEC) 40 MG capsule Take 40 mg by mouth daily.    Marland Kitchen PROAIR HFA 108 (90 Base) MCG/ACT inhaler     . rosuvastatin (CRESTOR) 20 MG tablet Take 20 mg by mouth  daily.    Marland Kitchen zolpidem (AMBIEN) 10 MG tablet      No current facility-administered medications for this visit.    PHYSICAL EXAMINATION:   BP 128/77 mmHg  Pulse 83  Temp(Src) 96.9 F (36.1 C) (Tympanic)  Resp 18  Wt 183 lb 13.8 oz (83.4 kg)  Filed Weights   02/02/16 1132  Weight: 183 lb 13.8 oz (83.4 kg)    GENERAL: Well-nourished well-developed; Alert, no distress and comfortable.  Alone.  EYES: no pallor or icterus OROPHARYNX: no thrush or ulceration; good dentition  NECK: supple, no masses felt LYMPH:  no palpable lymphadenopathy in the cervical, axillary or inguinal regions LUNGS: clear to auscultation and  No wheeze or crackles HEART/CVS: regular rate & rhythm and no murmurs; No lower extremity edema ABDOMEN:abdomen soft, non-tender and normal bowel sounds Musculoskeletal:no cyanosis of digits and no clubbing  PSYCH: alert & oriented x 3 with fluent speech NEURO: no focal motor/sensory deficits SKIN:  no rashes or significant lesions  LABORATORY DATA:  I have reviewed the data as listed    Component Value Date/Time   NA 142 08/22/2014 0918   K 3.6 08/22/2014 0918   CL 106 08/22/2014 0918   CO2 27 08/22/2014 0918   GLUCOSE 90 08/22/2014 0918   BUN 17 08/22/2014 0918   CREATININE 0.58* 08/22/2014 0918   CALCIUM 8.7 08/22/2014 0918   PROT 7.1 08/22/2014 0918   ALBUMIN 3.8 08/22/2014 0918   AST 18 08/22/2014 0918   ALT 14 08/22/2014 0918   ALKPHOS 93 08/22/2014 0918   BILITOT 0.4 08/22/2014 0918   GFRNONAA >60 08/22/2014 0918   GFRNONAA >60 11/21/2012 0434   GFRAA >60 08/22/2014 0918   GFRAA >60 11/21/2012 0434    No results found for: SPEP, UPEP  Lab Results  Component Value Date   WBC 7.7 11/13/2014   NEUTROABS 4.9 11/13/2014   HGB 12.4 11/13/2014   HCT 40.3 11/13/2014   MCV 71* 11/13/2014   PLT 435 11/13/2014      Chemistry      Component Value Date/Time   NA 142 08/22/2014 0918   K 3.6 08/22/2014 0918   CL 106 08/22/2014 0918   CO2 27  08/22/2014 0918   BUN 17 08/22/2014 0918   CREATININE 0.58* 08/22/2014 0918      Component Value Date/Time   CALCIUM 8.7 08/22/2014 0918   ALKPHOS 93 08/22/2014 0918   AST 18 08/22/2014 0918   ALT 14 08/22/2014 0918   BILITOT 0.4 08/22/2014 0918         ASSESSMENT & PLAN:   #  Iron deficient anemia- Secondary to malabsorption/  History of gastric bypass.   Most recent hemoglobin  In the end of March 10.3. Patient intolerant to by mouth  iron. She is symptomatic.  Recommend checking CBC and iron studies ferritin today.  Tentatively plan IV iron Feraheme weekly 2.  Also check B12.   #  Recommend follow-up in  4 months/ possible IV iron;  Check CBC iron studies and ferritin.  Patient asked to call us sooner if she  Did not have significant improvement of her symptoms.      Cammie Sickle, MD 02/02/2016 11:58 AM

## 2016-02-03 ENCOUNTER — Inpatient Hospital Stay: Payer: 59

## 2016-02-03 VITALS — BP 114/71 | HR 69 | Temp 97.3°F | Resp 20

## 2016-02-03 DIAGNOSIS — D508 Other iron deficiency anemias: Secondary | ICD-10-CM

## 2016-02-03 DIAGNOSIS — D509 Iron deficiency anemia, unspecified: Secondary | ICD-10-CM | POA: Diagnosis not present

## 2016-02-03 MED ORDER — SODIUM CHLORIDE 0.9 % IV SOLN
510.0000 mg | Freq: Once | INTRAVENOUS | Status: AC
  Administered 2016-02-03: 510 mg via INTRAVENOUS
  Filled 2016-02-03: qty 17

## 2016-02-03 MED ORDER — SODIUM CHLORIDE 0.9 % IV SOLN
Freq: Once | INTRAVENOUS | Status: AC
  Administered 2016-02-03: 15:00:00 via INTRAVENOUS
  Filled 2016-02-03: qty 1000

## 2016-02-10 ENCOUNTER — Inpatient Hospital Stay: Payer: 59

## 2016-02-13 ENCOUNTER — Inpatient Hospital Stay: Payer: 59

## 2016-02-13 VITALS — BP 123/79 | HR 62 | Temp 97.0°F | Resp 20

## 2016-02-13 DIAGNOSIS — D509 Iron deficiency anemia, unspecified: Secondary | ICD-10-CM | POA: Diagnosis not present

## 2016-02-13 DIAGNOSIS — D508 Other iron deficiency anemias: Secondary | ICD-10-CM

## 2016-02-13 MED ORDER — SODIUM CHLORIDE 0.9 % IV SOLN
510.0000 mg | Freq: Once | INTRAVENOUS | Status: AC
  Administered 2016-02-13: 510 mg via INTRAVENOUS
  Filled 2016-02-13: qty 17

## 2016-02-13 MED ORDER — SODIUM CHLORIDE 0.9 % IV SOLN
Freq: Once | INTRAVENOUS | Status: AC
  Administered 2016-02-13: 10:00:00 via INTRAVENOUS
  Filled 2016-02-13: qty 1000

## 2016-05-27 ENCOUNTER — Other Ambulatory Visit: Payer: Self-pay | Admitting: Nurse Practitioner

## 2016-05-28 ENCOUNTER — Other Ambulatory Visit: Payer: Self-pay

## 2016-05-28 ENCOUNTER — Inpatient Hospital Stay: Payer: 59 | Attending: Internal Medicine

## 2016-05-28 DIAGNOSIS — F17210 Nicotine dependence, cigarettes, uncomplicated: Secondary | ICD-10-CM | POA: Diagnosis not present

## 2016-05-28 DIAGNOSIS — E7800 Pure hypercholesterolemia, unspecified: Secondary | ICD-10-CM | POA: Diagnosis not present

## 2016-05-28 DIAGNOSIS — E119 Type 2 diabetes mellitus without complications: Secondary | ICD-10-CM | POA: Insufficient documentation

## 2016-05-28 DIAGNOSIS — D509 Iron deficiency anemia, unspecified: Secondary | ICD-10-CM | POA: Diagnosis not present

## 2016-05-28 DIAGNOSIS — Z9884 Bariatric surgery status: Secondary | ICD-10-CM | POA: Insufficient documentation

## 2016-05-28 DIAGNOSIS — Z7984 Long term (current) use of oral hypoglycemic drugs: Secondary | ICD-10-CM | POA: Insufficient documentation

## 2016-05-28 DIAGNOSIS — Z853 Personal history of malignant neoplasm of breast: Secondary | ICD-10-CM | POA: Diagnosis not present

## 2016-05-28 DIAGNOSIS — M1990 Unspecified osteoarthritis, unspecified site: Secondary | ICD-10-CM | POA: Diagnosis not present

## 2016-05-28 DIAGNOSIS — Z9012 Acquired absence of left breast and nipple: Secondary | ICD-10-CM | POA: Diagnosis not present

## 2016-05-28 DIAGNOSIS — E039 Hypothyroidism, unspecified: Secondary | ICD-10-CM | POA: Diagnosis not present

## 2016-05-28 DIAGNOSIS — Z79899 Other long term (current) drug therapy: Secondary | ICD-10-CM | POA: Diagnosis not present

## 2016-05-28 LAB — CBC WITH DIFFERENTIAL/PLATELET
BASOS PCT: 1 %
Basophils Absolute: 0.1 10*3/uL (ref 0–0.1)
EOS ABS: 0.3 10*3/uL (ref 0–0.7)
Eosinophils Relative: 3 %
HEMATOCRIT: 39.9 % (ref 35.0–47.0)
HEMOGLOBIN: 13.8 g/dL (ref 12.0–16.0)
LYMPHS ABS: 2.1 10*3/uL (ref 1.0–3.6)
Lymphocytes Relative: 23 %
MCH: 28.1 pg (ref 26.0–34.0)
MCHC: 34.6 g/dL (ref 32.0–36.0)
MCV: 81.1 fL (ref 80.0–100.0)
Monocytes Absolute: 0.5 10*3/uL (ref 0.2–0.9)
Monocytes Relative: 5 %
NEUTROS ABS: 6 10*3/uL (ref 1.4–6.5)
Neutrophils Relative %: 68 %
Platelets: 266 10*3/uL (ref 150–440)
RBC: 4.92 MIL/uL (ref 3.80–5.20)
RDW: 15.1 % — ABNORMAL HIGH (ref 11.5–14.5)
WBC: 9.1 10*3/uL (ref 3.6–11.0)

## 2016-05-28 LAB — IRON AND TIBC
IRON: 31 ug/dL (ref 28–170)
Saturation Ratios: 6 % — ABNORMAL LOW (ref 10.4–31.8)
TIBC: 483 ug/dL — AB (ref 250–450)
UIBC: 452 ug/dL

## 2016-05-28 LAB — FERRITIN: Ferritin: 5 ng/mL — ABNORMAL LOW (ref 11–307)

## 2016-05-31 ENCOUNTER — Inpatient Hospital Stay: Payer: 59

## 2016-05-31 ENCOUNTER — Inpatient Hospital Stay (HOSPITAL_BASED_OUTPATIENT_CLINIC_OR_DEPARTMENT_OTHER): Payer: 59 | Admitting: Internal Medicine

## 2016-05-31 VITALS — BP 136/76 | HR 78 | Resp 20

## 2016-05-31 DIAGNOSIS — Z9012 Acquired absence of left breast and nipple: Secondary | ICD-10-CM

## 2016-05-31 DIAGNOSIS — D508 Other iron deficiency anemias: Secondary | ICD-10-CM

## 2016-05-31 DIAGNOSIS — Z853 Personal history of malignant neoplasm of breast: Secondary | ICD-10-CM

## 2016-05-31 DIAGNOSIS — D509 Iron deficiency anemia, unspecified: Secondary | ICD-10-CM

## 2016-05-31 DIAGNOSIS — Z9884 Bariatric surgery status: Secondary | ICD-10-CM | POA: Diagnosis not present

## 2016-05-31 DIAGNOSIS — F17210 Nicotine dependence, cigarettes, uncomplicated: Secondary | ICD-10-CM

## 2016-05-31 DIAGNOSIS — Z79899 Other long term (current) drug therapy: Secondary | ICD-10-CM

## 2016-05-31 MED ORDER — SODIUM CHLORIDE 0.9 % IV SOLN
Freq: Once | INTRAVENOUS | Status: AC
  Administered 2016-05-31: 15:00:00 via INTRAVENOUS
  Filled 2016-05-31: qty 1000

## 2016-05-31 MED ORDER — SODIUM CHLORIDE 0.9 % IV SOLN
510.0000 mg | Freq: Once | INTRAVENOUS | Status: AC
  Administered 2016-05-31: 510 mg via INTRAVENOUS
  Filled 2016-05-31: qty 17

## 2016-05-31 NOTE — Progress Notes (Signed)
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 Bary Castilla ; April 2017- IV ferrahem x2.   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  Patient have IV Feraheme 2 approximately 3 months ago. Significant improvement of the fatigue. But not completely resolved.  Poor tolerance of by mouth iron. No nausea no vomiting. Otherwise  REVIEW OF SYSTEMS:  A complete 10 point review of system is done which is negative except mentioned above/history of present illness.   PAST MEDICAL HISTORY :  Past Medical History:  Diagnosis Date  . Breast cancer ([place]) 1996   LEFT MASTECTOMY DCIS  . Diabetes mellitus without complication ([place])   . Headache   . Heart murmur   . High cholesterol   . Hypothyroidism   . IDA (iron deficiency anemia)   . Osteoarthritis    h/o Reclast injection  . Pneumonia   . Thyroid condition 1991  . Upper GI bleed 2014    PAST SURGICAL HISTORY :   Past Surgical History:  Procedure Laterality Date  . ABDOMINAL HYSTERECTOMY  1997  . BREAST BIOPSY Right 2003   neg  . CARPAL TUNNEL RELEASE Bilateral [id number]  . COLONOSCOPY  2006   Dr. Bary Castilla, hyperplastic polyp of the rectum. Diverticulosis.  . COLONOSCOPY WITH PROPOFOL N/A 10/08/2015   Procedure: COLONOSCOPY WITH PROPOFOL;  Surgeon: Robert Bellow, MD;  Location[facility] ENDOSCOPY;  Service: Endoscopy;  Laterality: N/A;  . ESOPHAGOGASTRODUODENOSCOPY (EGD) WITH PROPOFOL N/A 10/08/2015   Procedure: ESOPHAGOGASTRODUODENOSCOPY (EGD) WITH PROPOFOL;  Surgeon:  Robert Bellow, MD;  Location: [facility] ENDOSCOPY;  Service: Endoscopy;  Laterality: N/A;  . GASTRIC BYPASS  2010  . [place]  . KNEE SURGERY Right 2011  . MASTECTOMY Left 1996   DCIS  . NECK SURGERY  04/2013  . subclavical osteoarthritis removal    . TONSILLECTOMY  1984  . UPPER GI ENDOSCOPY  2010, 2014, 2015, 2016    FAMILY HISTORY :   Family History  Problem Relation Age of Onset  . Lung cancer Mother   . Hodgkin's lymphoma Father     SOCIAL HISTORY:   Social History  Substance Use Topics  . Smoking status: Current Every Day Smoker    Packs/day: 1.00    Years: 15.00  . Smokeless tobacco: Never Used  . Alcohol use No    ALLERGIES:  has No Known Allergies.  MEDICATIONS:  Current Outpatient Prescriptions  Medication Sig Dispense Refill  . ALPRAZolam (XANAX) 0.25 MG tablet     . atorvastatin (LIPITOR) 20 MG tablet TK 1 T PO QD  0  . Canagliflozin (INVOKANA) 300 MG TABS Take 300 mg by mouth daily.    . DULoxetine (CYMBALTA) 60 MG capsule Take 60 mg by mouth daily.    . ergocalciferol (VITAMIN D2) 50000 UNITS capsule Take 50,000 Units by mouth once a week.    . levothyroxine (SYNTHROID, LEVOTHROID) 150 MCG tablet Take 150 mcg by mouth daily.    . metFORMIN (GLUMETZA) 500 MG (MOD) 24 hr tablet Take 500 mg by mouth daily  with breakfast.    . Multiple Vitamin (MULTIVITAMIN) tablet Take 1 tablet by mouth daily.    Marland Kitchen omeprazole (PRILOSEC) 40 MG capsule Take 40 mg by mouth daily.    Marland Kitchen PROAIR HFA 108 (90 Base) MCG/ACT inhaler     . rosuvastatin (CRESTOR) 20 MG tablet Take 20 mg by mouth daily.    Marland Kitchen zolpidem (AMBIEN) 10 MG tablet      No current facility-administered medications for this visit.     PHYSICAL EXAMINATION:   BP 137/79 (BP Location: Left Arm, Patient Position: Sitting)   Pulse 80   Temp 97.3 F (36.3 C) (Tympanic)   Resp 18   Wt 186 lb 8 oz (84.6 kg)   BMI 29.21 kg/m   Filed Weights   05/31/16 1409  Weight: 186 lb 8 oz (84.6 kg)     GENERAL: Well-nourished well-developed; Alert, no distress and comfortable.  Alone.  EYES: no pallor or icterus OROPHARYNX: no thrush or ulceration; good dentition  NECK: supple, no masses felt LYMPH:  no palpable lymphadenopathy in the cervical, axillary or inguinal regions LUNGS: clear to auscultation and  No wheeze or crackles HEART/CVS: regular rate & rhythm and no murmurs; No lower extremity edema ABDOMEN:abdomen soft, non-tender and normal bowel sounds Musculoskeletal:no cyanosis of digits and no clubbing  PSYCH: alert & oriented x 3 with fluent speech NEURO: no focal motor/sensory deficits SKIN:  no rashes or significant lesions  LABORATORY DATA:  I have reviewed the data as listed    Component Value Date/Time   NA 142 08/22/2014 0918   K 3.6 08/22/2014 0918   CL 106 08/22/2014 0918   CO2 27 08/22/2014 0918   GLUCOSE 90 08/22/2014 0918   BUN 17 08/22/2014 0918   CREATININE 0.58 (L) 08/22/2014 0918   CALCIUM 8.7 08/22/2014 0918   PROT 7.1 08/22/2014 0918   ALBUMIN 3.8 08/22/2014 0918   AST 18 08/22/2014 0918   ALT 14 08/22/2014 0918   ALKPHOS 93 08/22/2014 0918   BILITOT 0.4 08/22/2014 0918   GFRNONAA >60 08/22/2014 0918   GFRNONAA >60 11/21/2012 0434   GFRAA >60 08/22/2014 0918   GFRAA >60 11/21/2012 0434    No results found for: SPEP, UPEP  Lab Results  Component Value Date   WBC 9.1 05/28/2016   NEUTROABS 6.0 05/28/2016   HGB 13.8 05/28/2016   HCT 39.9 05/28/2016   MCV 81.1 05/28/2016   PLT 266 05/28/2016      Chemistry      Component Value Date/Time   NA 142 08/22/2014 0918   K 3.6 08/22/2014 0918   CL 106 08/22/2014 0918   CO2 27 08/22/2014 0918   BUN 17 08/22/2014 0918   CREATININE 0.58 (L) 08/22/2014 0918      Component Value Date/Time   CALCIUM 8.7 08/22/2014 0918   ALKPHOS 93 08/22/2014 0918   AST 18 08/22/2014 0918   ALT 14 08/22/2014 0918   BILITOT 0.4 08/22/2014 0918         ASSESSMENT & PLAN:   Other iron deficiency  anemias #  Iron deficient anemia- Secondary to malabsorption/  History of gastric bypass.  Ferritin-5/ sat- ; recommend IV Ferrhem today.   # Labs-cbc/ iron studies/infusion in 3 & 6 M; Md in 6 months.      Cammie Sickle, MD 05/31/2016 4:55 PM

## 2016-05-31 NOTE — Assessment & Plan Note (Addendum)
#    Iron deficient anemia- Secondary to malabsorption/  History of gastric bypass. Hemoglobin 12.9. Ferritin-5/ sat- patient still tired.; recommend IV Ferrhem today.   # Labs-cbc/ iron studies/infusion in 3 & 6 M; Md in 6 months.

## 2016-06-01 ENCOUNTER — Other Ambulatory Visit: Payer: Self-pay

## 2016-06-01 DIAGNOSIS — D508 Other iron deficiency anemias: Secondary | ICD-10-CM

## 2016-06-16 ENCOUNTER — Other Ambulatory Visit: Payer: Self-pay | Admitting: General Surgery

## 2016-06-16 DIAGNOSIS — Z853 Personal history of malignant neoplasm of breast: Secondary | ICD-10-CM

## 2016-06-30 ENCOUNTER — Encounter: Payer: Self-pay | Admitting: *Deleted

## 2016-08-11 ENCOUNTER — Ambulatory Visit: Payer: 59 | Attending: General Surgery

## 2016-08-17 ENCOUNTER — Telehealth: Payer: Self-pay | Admitting: *Deleted

## 2016-08-17 NOTE — Telephone Encounter (Signed)
Called patient to let her know that her appointment on 08/24/16 was canceled because she was a no show for mammogram. Patient needs to r/s mammogram

## 2016-08-24 ENCOUNTER — Ambulatory Visit: Payer: 59 | Admitting: General Surgery

## 2016-08-30 ENCOUNTER — Inpatient Hospital Stay: Payer: 59

## 2016-08-31 ENCOUNTER — Inpatient Hospital Stay: Payer: 59

## 2016-09-06 ENCOUNTER — Inpatient Hospital Stay: Payer: 59 | Attending: Internal Medicine

## 2016-09-06 DIAGNOSIS — Z9884 Bariatric surgery status: Secondary | ICD-10-CM | POA: Insufficient documentation

## 2016-09-06 DIAGNOSIS — Z7984 Long term (current) use of oral hypoglycemic drugs: Secondary | ICD-10-CM | POA: Insufficient documentation

## 2016-09-06 DIAGNOSIS — E7800 Pure hypercholesterolemia, unspecified: Secondary | ICD-10-CM | POA: Insufficient documentation

## 2016-09-06 DIAGNOSIS — D509 Iron deficiency anemia, unspecified: Secondary | ICD-10-CM | POA: Insufficient documentation

## 2016-09-06 DIAGNOSIS — M1990 Unspecified osteoarthritis, unspecified site: Secondary | ICD-10-CM | POA: Diagnosis not present

## 2016-09-06 DIAGNOSIS — D508 Other iron deficiency anemias: Secondary | ICD-10-CM

## 2016-09-06 DIAGNOSIS — Z853 Personal history of malignant neoplasm of breast: Secondary | ICD-10-CM | POA: Diagnosis not present

## 2016-09-06 DIAGNOSIS — Z79899 Other long term (current) drug therapy: Secondary | ICD-10-CM | POA: Diagnosis not present

## 2016-09-06 DIAGNOSIS — E119 Type 2 diabetes mellitus without complications: Secondary | ICD-10-CM | POA: Insufficient documentation

## 2016-09-06 DIAGNOSIS — F17210 Nicotine dependence, cigarettes, uncomplicated: Secondary | ICD-10-CM | POA: Insufficient documentation

## 2016-09-06 DIAGNOSIS — E039 Hypothyroidism, unspecified: Secondary | ICD-10-CM | POA: Diagnosis not present

## 2016-09-06 DIAGNOSIS — Z9012 Acquired absence of left breast and nipple: Secondary | ICD-10-CM | POA: Diagnosis not present

## 2016-09-06 LAB — CBC WITH DIFFERENTIAL/PLATELET
Basophils Absolute: 0.1 10*3/uL (ref 0–0.1)
Basophils Relative: 1 %
Eosinophils Absolute: 0.3 10*3/uL (ref 0–0.7)
Eosinophils Relative: 3 %
HEMATOCRIT: 46.3 % (ref 35.0–47.0)
HEMOGLOBIN: 15.9 g/dL (ref 12.0–16.0)
LYMPHS ABS: 1.8 10*3/uL (ref 1.0–3.6)
LYMPHS PCT: 22 %
MCH: 29.5 pg (ref 26.0–34.0)
MCHC: 34.4 g/dL (ref 32.0–36.0)
MCV: 85.8 fL (ref 80.0–100.0)
MONOS PCT: 6 %
Monocytes Absolute: 0.5 10*3/uL (ref 0.2–0.9)
NEUTROS ABS: 5.4 10*3/uL (ref 1.4–6.5)
NEUTROS PCT: 68 %
Platelets: 321 10*3/uL (ref 150–440)
RBC: 5.4 MIL/uL — AB (ref 3.80–5.20)
RDW: 14.6 % — ABNORMAL HIGH (ref 11.5–14.5)
WBC: 8.1 10*3/uL (ref 3.6–11.0)

## 2016-09-06 LAB — IRON AND TIBC
IRON: 116 ug/dL (ref 28–170)
SATURATION RATIOS: 24 % (ref 10.4–31.8)
TIBC: 482 ug/dL — ABNORMAL HIGH (ref 250–450)
UIBC: 366 ug/dL

## 2016-09-06 LAB — FERRITIN: Ferritin: 8 ng/mL — ABNORMAL LOW (ref 11–307)

## 2016-09-07 ENCOUNTER — Inpatient Hospital Stay: Payer: 59

## 2016-09-08 ENCOUNTER — Telehealth: Payer: Self-pay | Admitting: *Deleted

## 2016-09-08 NOTE — Telephone Encounter (Signed)
-----   Message from Cammie Sickle, MD sent at 09/07/2016  2:49 PM EST ----- Please make sure patient gets [facility] tomorrow. Thx

## 2016-09-08 NOTE — Telephone Encounter (Signed)
Sch. Team contacted patient. Pt was sch. For IV iron on Tuesday 11/21. Pt no showed for this apt. Sch. Team attempted to r/s this iv iron but pt states that she would not be available until after Christmas for IV infusions. Pt will contact cancer ctr back to r/s this at a later date.

## 2016-09-28 ENCOUNTER — Ambulatory Visit: Admission: RE | Admit: 2016-09-28 | Payer: 59 | Source: Ambulatory Visit

## 2016-09-30 ENCOUNTER — Ambulatory Visit: Payer: 59 | Admitting: General Surgery

## 2016-11-30 ENCOUNTER — Inpatient Hospital Stay: Payer: 59

## 2016-12-01 ENCOUNTER — Inpatient Hospital Stay: Payer: 59 | Admitting: Internal Medicine

## 2016-12-01 ENCOUNTER — Inpatient Hospital Stay: Payer: 59

## 2016-12-02 ENCOUNTER — Other Ambulatory Visit: Payer: Self-pay

## 2016-12-02 DIAGNOSIS — Z1231 Encounter for screening mammogram for malignant neoplasm of breast: Secondary | ICD-10-CM

## 2016-12-08 ENCOUNTER — Inpatient Hospital Stay: Payer: 59 | Admitting: Internal Medicine

## 2016-12-08 ENCOUNTER — Inpatient Hospital Stay: Payer: 59

## 2016-12-09 ENCOUNTER — Emergency Department
Admission: EM | Admit: 2016-12-09 | Discharge: 2016-12-09 | Disposition: A | Discharge status: Home / Self Care | Payer: 59 | Attending: Emergency Medicine | Admitting: Emergency Medicine

## 2016-12-09 ENCOUNTER — Encounter: Payer: Self-pay | Admitting: Emergency Medicine

## 2016-12-09 ENCOUNTER — Emergency Department: Payer: 59

## 2016-12-09 DIAGNOSIS — E119 Type 2 diabetes mellitus without complications: Secondary | ICD-10-CM | POA: Diagnosis not present

## 2016-12-09 DIAGNOSIS — J111 Influenza due to unidentified influenza virus with other respiratory manifestations: Secondary | ICD-10-CM | POA: Insufficient documentation

## 2016-12-09 DIAGNOSIS — Z7984 Long term (current) use of oral hypoglycemic drugs: Secondary | ICD-10-CM | POA: Diagnosis not present

## 2016-12-09 DIAGNOSIS — E039 Hypothyroidism, unspecified: Secondary | ICD-10-CM | POA: Insufficient documentation

## 2016-12-09 DIAGNOSIS — R0602 Shortness of breath: Secondary | ICD-10-CM | POA: Diagnosis present

## 2016-12-09 DIAGNOSIS — Z79899 Other long term (current) drug therapy: Secondary | ICD-10-CM | POA: Diagnosis not present

## 2016-12-09 DIAGNOSIS — F17210 Nicotine dependence, cigarettes, uncomplicated: Secondary | ICD-10-CM | POA: Insufficient documentation

## 2016-12-09 DIAGNOSIS — J4521 Mild intermittent asthma with (acute) exacerbation: Secondary | ICD-10-CM | POA: Insufficient documentation

## 2016-12-09 DIAGNOSIS — Z853 Personal history of malignant neoplasm of breast: Secondary | ICD-10-CM | POA: Diagnosis not present

## 2016-12-09 HISTORY — DX: Unspecified asthma, uncomplicated: J45.909

## 2016-12-09 MED ORDER — IPRATROPIUM-ALBUTEROL 0.5-2.5 (3) MG/3ML IN SOLN: 3.0000 mL | RESPIRATORY_TRACT | 0 refills | Status: DC | PRN

## 2016-12-09 MED ORDER — IPRATROPIUM-ALBUTEROL 0.5-2.5 (3) MG/3ML IN SOLN
RESPIRATORY_TRACT | Status: AC
  Filled 2016-12-09: qty 3

## 2016-12-09 MED ORDER — IPRATROPIUM-ALBUTEROL 0.5-2.5 (3) MG/3ML IN SOLN
3.0000 mL | Freq: Once | RESPIRATORY_TRACT | Status: AC
  Administered 2016-12-09: 3 mL via RESPIRATORY_TRACT

## 2016-12-09 MED ORDER — PREDNISONE 10 MG PO TABS: ORAL_TABLET | ORAL | 0 refills | Status: DC

## 2016-12-09 MED ORDER — HYDROCOD POLST-CPM POLST ER 10-8 MG/5ML PO SUER: 5.0000 mL | Freq: Two times a day (BID) | ORAL | 0 refills | Status: DC | PRN

## 2016-12-09 NOTE — ED Notes (Signed)
NAD noted at time of D/C. Pt denies questions or concerns. Pt ambulatory to the lobby at this time.  

## 2016-12-09 NOTE — Discharge Instructions (Signed)
Follow-up with your primary care doctor if any continued problems. Finish your Tamiflu tonight. Discontinue taking your cough medication. Begin prednisone 60 mg 6 day taper. Tussionex 1 teaspoon twice a day as needed for cough. Duoneb nebulizer solution every 4 hours as needed for cough or wheezing. Return to the emergency room if any severe worsening of your symptoms.

## 2016-12-09 NOTE — ED Provider Notes (Signed)
[facility] Emergency Department Provider Note   ____________________________________________   First MD Initiated Contact with Patient 12/09/16 [phone]     (approximate)  I have reviewed the triage vital signs and the nursing notes.   HISTORY  Chief Complaint Shortness of Breath and Cough    HPI Erin Tate is a 56 y.o. female is here with complaint of cough, congestion and states that she was diagnosed with influenza on Monday and started on Tamiflu. She will have her last dose of Tamiflu tonight. She is also been taking Cheratussin without any relief of her cough. She states that gradually she has gotten worse and now is wheezing. She also has productive cough. Patient has a history of asthma and uses a nebulizer at home. She last used this earlier this morning. She states it helps for short periods of times with her wheezing. Patient is unaware of any fever or chills. She denies any nausea, vomiting or diarrhea. Patient states he has been years since she has had be on any steroids that has required them in the past.  Patient continues to smoke at one pack cigarettes per day for the last 15 years. She is concerned that she may have pneumonia. She rates her pain as 4 out of 10.   Past Medical History:  Diagnosis Date  . Asthma   . Breast cancer ([place]) 1996   LEFT MASTECTOMY DCIS  . Diabetes mellitus without complication ([place])   . Headache   . Heart murmur   . High cholesterol   . Hypothyroidism   . IDA (iron deficiency anemia)   . Osteoarthritis    h/o Reclast injection  . Pneumonia   . Thyroid condition 1991  . Upper GI bleed 2014    Patient Active Problem List   Diagnosis Date Noted  . Other iron deficiency anemia 02/02/2016  . Anastomotic ulcer S/P gastric bypass 08/21/2015  . Encounter for screening colonoscopy 08/21/2015  . 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 Abdominal pain, epigastric 09/03/2014  . Personal history of malignant neoplasm of  breast 01/09/2014  . Breast cancer in situ 01/08/2013    Past Surgical History:  Procedure Laterality Date  . ABDOMINAL HYSTERECTOMY  1997  . BREAST BIOPSY Right 2003   neg  . CARPAL TUNNEL RELEASE Bilateral [id number]  . COLONOSCOPY  2006   Dr. Bary Castilla, hyperplastic polyp of the rectum. Diverticulosis.  . COLONOSCOPY WITH PROPOFOL N/A 10/08/2015   Procedure: COLONOSCOPY WITH PROPOFOL;  Surgeon: Robert Bellow, MD;  Location: [facility] ENDOSCOPY;  Service: Endoscopy;  Laterality: N/A;  . ESOPHAGOGASTRODUODENOSCOPY (EGD) WITH PROPOFOL N/A 10/08/2015   Procedure: ESOPHAGOGASTRODUODENOSCOPY (EGD) WITH PROPOFOL;  Surgeon: Robert Bellow, MD;  Location: [facility] ENDOSCOPY;  Service: Endoscopy;  Laterality: N/A;  . GASTRIC BYPASS  2010  . [place]  . KNEE SURGERY Right 2011  . MASTECTOMY Left 1996   DCIS  . NECK SURGERY  04/2013  . subclavical osteoarthritis removal    . TONSILLECTOMY  1984  . UPPER GI ENDOSCOPY  2010, 2014, 2015, 2016    Prior to Admission medications   Medication Sig Start Date End Date Taking? Authorizing Provider  ALPRAZolam Duanne Moron) 0.25 MG tablet  01/30/16   Historical Provider, MD  atorvastatin (LIPITOR) 20 MG tablet TK 1 T PO QD 11/16/15   Historical Provider, MD  Canagliflozin (INVOKANA) 300 MG TABS Take 300 mg by mouth daily.    Historical Provider, MD  chlorpheniramine-HYDROcodone Amanda Cockayne PENNKINETIC ER) 10-8 MG/5ML SUER Take  5 mLs by mouth every 12 (twelve) hours as needed for cough. 12/09/16   Johnn Hai, PA-C  DULoxetine (CYMBALTA) 60 MG capsule Take 60 mg by mouth daily.    Historical Provider, MD  ergocalciferol (VITAMIN D2) 50000 UNITS capsule Take 50,000 Units by mouth once a week.    Historical Provider, MD  ipratropium-albuterol (DUONEB) 0.5-2.5 (3) MG/3ML SOLN Take 3 mLs by nebulization every 4 (four) hours as needed (wheezing or coughing). 12/09/16   Johnn Hai, PA-C  levothyroxine (SYNTHROID, LEVOTHROID) 150 MCG tablet Take  150 mcg by mouth daily.    Historical Provider, MD  metFORMIN (GLUMETZA) 500 MG (MOD) 24 hr tablet Take 500 mg by mouth daily with breakfast.    Historical Provider, MD  Multiple Vitamin (MULTIVITAMIN) tablet Take 1 tablet by mouth daily.    Historical Provider, MD  omeprazole (PRILOSEC) 40 MG capsule Take 40 mg by mouth daily.    Historical Provider, MD  predniSONE (DELTASONE) 10 MG tablet Take 6 tablets  today, on day 2 take 5 tablets, day 3 take 4 tablets, day 4 take 3 tablets, day 5 take  2 tablets and 1 tablet the last day 12/09/16   [place]omena Course  [facility] HFA 108 [phone]) MCG/ACT inhaler  12/02/15   Historical Provider, MD  rosuvastatin (CRESTOR) 20 MG tablet Take 20 mg by mouth daily.    Historical Provider, MD  zolpidem (AMBIEN) 10 MG tablet  01/30/16   Historical Provider, MD    Allergies Patient has no known allergies.  Family History  Problem Relation Age of Onset  . Lung cancer Mother   . Hodgkin's lymphoma Father     Social History Social History  Substance Use Topics  . Smoking status: Current Every Day Smoker    Packs/day: 1.00    Years: 15.00  . Smokeless tobacco: Never Used  . Alcohol use No    Review of Systems Constitutional: No fever/chills Eyes: No visual changes. ENT: No sore throat. Positive nasal congestion. Cardiovascular: Denies chest pain. Respiratory: Denies shortness of breath. Positive wheezing. Positive cough. Gastrointestinal: No abdominal pain.  No nausea, no vomiting.  No diarrhea.  Genitourinary: Negative for dysuria. Musculoskeletal: Negative for back pain. Skin: Negative for rash. Neurological: Negative for headaches, focal weakness or numbness.  10-point ROS otherwise negative.  ____________________________________________   PHYSICAL EXAM:  VITAL SIGNS: ED Triage Vitals [12/09/16 0714]  Enc Vitals Group     BP      Pulse      Resp      Temp      Temp src      SpO2      Weight      Height      Head Circumference       Peak Flow      Pain Score 4     Pain Loc      Pain Edu?      Excl. in [place]?     Constitutional: Alert and oriented. Well appearing and in no acute distress. Eyes: Conjunctivae are normal. PERRL. EOMI. Head: Atraumatic. Nose: Minimal congestion/rhinnorhea. Mouth/Throat: Mucous membranes are moist.  Oropharynx non-erythematous. Neck: No stridor.   Hematological/Lymphatic/Immunilogical: No cervical lymphadenopathy. Cardiovascular: Normal rate, regular rhythm. Grossly normal heart sounds.  Good peripheral circulation. Respiratory: Normal respiratory effort.  No retractions. Lungs bilateral expiratory wheezes heard throughout. Patient is able to speak in complete sentences without any difficulty. Gastrointestinal: Soft and nontender. No distention.  Musculoskeletal: No lower extremity tenderness  nor edema.  No joint effusions. Neurologic:  Normal speech and language. No gross focal neurologic deficits are appreciated. No gait instability. Skin:  Skin is warm, dry and intact. No rash noted. Psychiatric: Mood and affect are normal. Speech and behavior are normal.  ____________________________________________   LABS (all labs ordered are listed, but only abnormal results are displayed)  Labs Reviewed - No data to display  RADIOLOGY  Chest x-ray per radiologist is negative other than mild thoracic dextroscoliosis with thoracolumbar levoscoliosis. I, Johnn Hai, personally viewed and evaluated these images (plain radiographs) as part of my medical decision making, as well as reviewing the written report by the radiologist. ____________________________________________   PROCEDURES  Procedure(s) performed: None  Procedures  Critical Care performed: No  ____________________________________________   INITIAL IMPRESSION / ASSESSMENT AND PLAN / ED COURSE  Pertinent labs & imaging results that were available during my care of the patient were reviewed by me and considered in my  medical decision making (see chart for details).  Patient was reassured that she did not have pneumonia. She was given a nebulizer treatment in the emergency room with some improvement of her wheezing. Patient was instructed to continue taking her Tamiflu until completely finished. She is to discontinue taking her Cheratussin. Patient was given a prescription for new DuoNeb treatments. She has a machine at home. She believes that the last ampule of solution that she used was dated 2016. Patient was also given a prescription for prednisone 60 mg 6 day taper. She will follow-up with her primary care if any continued problems.      ____________________________________________   FINAL CLINICAL IMPRESSION(S) / ED DIAGNOSES  Final diagnoses:  Mild intermittent asthma with exacerbation  Influenza      NEW MEDICATIONS STARTED DURING THIS VISIT:  Discharge Medication List as of 12/09/2016  8:46 AM    START taking these medications   Details  chlorpheniramine-HYDROcodone (TUSSIONEX PENNKINETIC ER) 10-8 MG/5ML SUER Take 5 mLs by mouth every 12 (twelve) hours as needed for cough., Starting Thu 12/09/2016, Print    ipratropium-albuterol (DUONEB) 0.5-2.5 (3) MG/3ML SOLN Take 3 mLs by nebulization every 4 (four) hours as needed (wheezing or coughing)., Starting Thu 12/09/2016, Print    predniSONE (DELTASONE) 10 MG tablet Take 6 tablets  today, on day 2 take 5 tablets, day 3 take 4 tablets, day 4 take 3 tablets, day 5 take  2 tablets and 1 tablet the last day, Print         Note:  This document was prepared using Dragon voice recognition software and may include unintentional dictation errors.    Johnn Hai, PA-C 12/09/16 Maupin Quigley, MD 12/09/16 [phone]

## 2016-12-09 NOTE — ED Triage Notes (Signed)
Dx with flu Monday and taking tamiflu and cheritus.  Says getting worse with wheezing an coughing up sm amt blood with green sputum.  Uses nebulizer.  Says wheezing is getting worse as well.

## 2017-01-03 ENCOUNTER — Inpatient Hospital Stay: Payer: 59 | Admitting: Internal Medicine

## 2017-01-03 ENCOUNTER — Encounter: Payer: Self-pay | Admitting: *Deleted

## 2017-01-03 ENCOUNTER — Inpatient Hospital Stay: Payer: 59

## 2017-01-05 ENCOUNTER — Encounter: Payer: Self-pay | Admitting: *Deleted

## 2017-01-05 NOTE — Progress Notes (Signed)
Cert. No show letter for dismissal- [employer] tracking number [id number]

## 2017-01-17 ENCOUNTER — Ambulatory Visit
Admission: RE | Admit: 2017-01-17 | Discharge: 2017-01-17 | Disposition: A | Discharge status: Home / Self Care | Payer: 59 | Source: Ambulatory Visit | Attending: General Surgery | Admitting: General Surgery

## 2017-01-17 DIAGNOSIS — Z1231 Encounter for screening mammogram for malignant neoplasm of breast: Secondary | ICD-10-CM

## 2017-01-24 ENCOUNTER — Encounter: Payer: Self-pay | Admitting: General Surgery

## 2017-01-24 ENCOUNTER — Ambulatory Visit (INDEPENDENT_AMBULATORY_CARE_PROVIDER_SITE_OTHER): Payer: 59 | Admitting: General Surgery

## 2017-01-24 VITALS — BP 121/74 | HR 76 | Resp 12 | Ht 68.0 in | Wt 194.0 lb

## 2017-01-24 DIAGNOSIS — Z853 Personal history of malignant neoplasm of breast: Secondary | ICD-10-CM | POA: Diagnosis not present

## 2017-01-24 NOTE — Patient Instructions (Signed)
Patient to return in one year with right screening mammogram.

## 2017-01-24 NOTE — Progress Notes (Signed)
Patient ID: Erin Tate, female   DOB: 1960-10-24, 56 y.o.   MRN: [id number]  Chief Complaint  Patient presents with  . Follow-up    mammogram    HPI Erin Tate is a 56 y.o. female who presents for a breast evaluation. The most recent mammogram was done on 01/17/17 .  Patient does perform regular self breast checks and gets regular mammograms done. Patient states no new Breast issues. Patient states she has been having occasioanal abdominal pain, she takes Naprosyn to help with pain. The patient reports that she did follow up with Dr. Margart Tate at [facility] in regards to her anastomotic ulcers. No additional therapy was recommended.  HPI  Past Medical History:  Diagnosis Date  . Asthma   . 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 Diabetes mellitus without complication ([place])   . Headache   . Heart murmur   . High cholesterol   . Hypothyroidism   . IDA (iron deficiency anemia)   . Osteoarthritis    h/o Reclast injection  . Pneumonia   . Thyroid condition 1991  . Upper GI bleed 2014    Past Surgical History:  Procedure Laterality Date  . ABDOMINAL HYSTERECTOMY  1997  . BREAST BIOPSY Right 2003   neg  . CARPAL TUNNEL RELEASE Bilateral [id number]  . COLONOSCOPY  2006   Dr. Bary Tate, hyperplastic polyp of the rectum. Diverticulosis.  . COLONOSCOPY WITH PROPOFOL N/A 10/08/2015   Procedure: COLONOSCOPY WITH PROPOFOL;  Surgeon: Erin Bellow, MD;  Location: [facility] ENDOSCOPY;  Service: Endoscopy;  Laterality: N/A;  . ESOPHAGOGASTRODUODENOSCOPY (EGD) WITH PROPOFOL N/A 10/08/2015   Procedure: ESOPHAGOGASTRODUODENOSCOPY (EGD) WITH PROPOFOL;  Surgeon: Erin Bellow, MD;  Location: [facility] ENDOSCOPY;  Service: Endoscopy;  Laterality: N/A;  . GASTRIC BYPASS  2010  . [place]  . KNEE SURGERY Right 2011  . MASTECTOMY Left 1996   DCIS  . NECK SURGERY  04/2013  . subclavical osteoarthritis removal    . TONSILLECTOMY  1984  . UPPER GI ENDOSCOPY   2010, 2014, 2015, 2016    Family History  Problem Relation Age of Onset  . Lung cancer Mother   . Hodgkin's lymphoma Father   . Breast cancer Paternal Aunt     Social History Social History  Substance Use Topics  . Smoking status: Current Some Day Smoker    Packs/day: 1.00    Years: 15.00    Types: E-cigarettes  . Smokeless tobacco: Never Used  . Alcohol use No    No Known Allergies  Current Outpatient Prescriptions  Medication Sig Dispense Refill  . atorvastatin (LIPITOR) 20 MG tablet TK 1 T PO QD  0  . Canagliflozin (INVOKANA) 300 MG TABS Take 300 mg by mouth daily.    . chlorpheniramine-HYDROcodone (TUSSIONEX PENNKINETIC ER) 10-8 MG/5ML SUER Take 5 mLs by mouth every 12 (twelve) hours as needed for cough. 115 mL 0  . DULoxetine (CYMBALTA) 60 MG capsule Take 60 mg by mouth daily.    . ergocalciferol (VITAMIN D2) 50000 UNITS capsule Take 50,000 Units by mouth once a week.    Marland Kitchen ipratropium-albuterol (DUONEB) 0.5-2.5 (3) MG/3ML SOLN Take 3 mLs by nebulization every 4 (four) hours as needed (wheezing or coughing). 360 mL 0  . levothyroxine (SYNTHROID, LEVOTHROID) 150 MCG tablet Take 150 mcg by mouth daily.    . metFORMIN (GLUMETZA) 500 MG (MOD) 24 hr tablet Take 500 mg by mouth daily with breakfast.    . Multiple  Vitamin (MULTIVITAMIN) tablet Take 1 tablet by mouth daily.    Marland Kitchen omeprazole (PRILOSEC) 40 MG capsule Take 40 mg by mouth daily.    . predniSONE (DELTASONE) 10 MG tablet Take 6 tablets  today, on day 2 take 5 tablets, day 3 take 4 tablets, day 4 take 3 tablets, day 5 take  2 tablets and 1 tablet the last day 21 tablet 0  . PROAIR HFA 108 (90 Base) MCG/ACT inhaler     . rosuvastatin (CRESTOR) 20 MG tablet Take 20 mg by mouth daily.     No current facility-administered medications for this visit.     Review of Systems Review of Systems  Constitutional: Negative.   Respiratory: Negative.   Cardiovascular: Negative.     Blood pressure 121/74, pulse 76, resp. rate  12, height 5\' 8"  (1.727 m), weight 194 lb (88 kg).  Physical Exam Physical Exam  Constitutional: She is oriented to person, place, and time. She appears well-developed and well-nourished.  Eyes: Conjunctivae are normal. No scleral icterus.  Neck: Neck supple.  Cardiovascular: Normal rate and regular rhythm.   Murmur heard.  Systolic murmur is present with a grade of 2/6  Pulmonary/Chest: Effort normal and breath sounds normal. Right breast exhibits no inverted nipple, no mass, no nipple discharge, no skin change and no tenderness.    Well healed incision  Lymphadenopathy:    She has no cervical adenopathy.    She has no axillary adenopathy.  Neurological: She is alert and oriented to person, place, and time.  Skin: Skin is warm and dry.    Data Reviewed January 17, 2017 mammogram was reviewed. IMPRESSION: No mammographic evidence of malignancy. A result letter of this screening mammogram will be mailed directly to the patient.  RECOMMENDATION: Screening mammogram in one year.  (Code:SM-R-59M)  BI-RADS CATEGORY  1: Negative.  Assessment    No evidence of recurrent disease.    Plan    A prescription for a new breast prosthesis and surgical bras was provided.      Patient to return in one year with right screening mammogram. The importance of avoiding nonsteroidals was reviewed considering her past history of anastomotic ulcer.  HPI, Physical Exam, Assessment and Plan have been scribed under the direction and in the presence of Erin Ard, MD.  Erin Tate, CMA  I have completed the exam and reviewed the above documentation for accuracy and completeness.  I agree with the above.  Erin Tate has been used and any errors in dictation or transcription are unintentional.  Erin Tate, M.D., F.A.C.S.      Erin Tate 01/25/2017, 8:39 PM

## 2017-08-13 IMAGING — CR DG CHEST 2V
1 series · 2 of 2 positions shown · non-contrast
Comparison: Chest radiograph August 22, 2014 and chest CT August 22, 2014

CLINICAL DATA: Cough and wheezing

EXAM:
CHEST  2 VIEW

[Series 1: dg chest 2 view · 0.14mm/px · 2 of 2 slices shown]
[im 1/2]
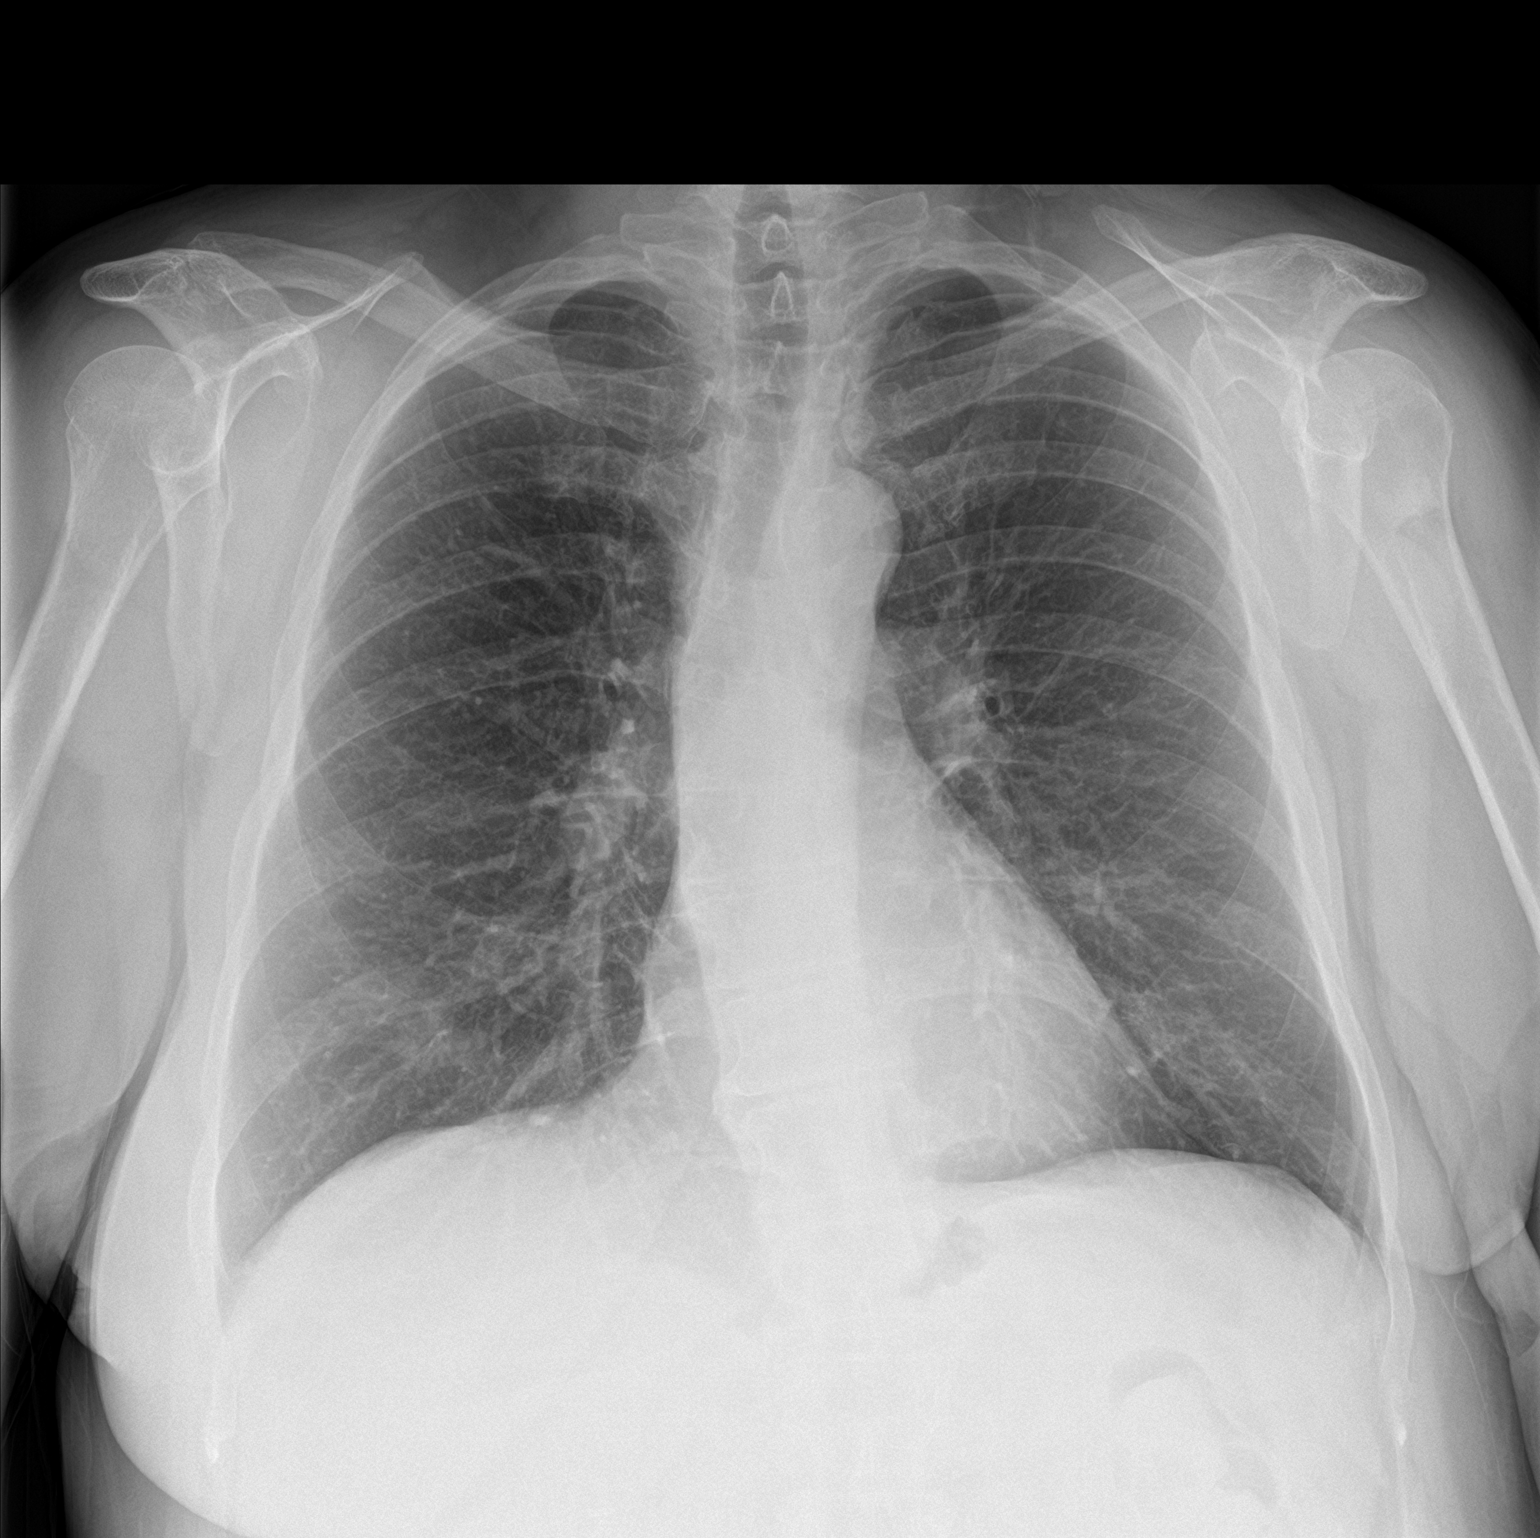
[im 2/2]
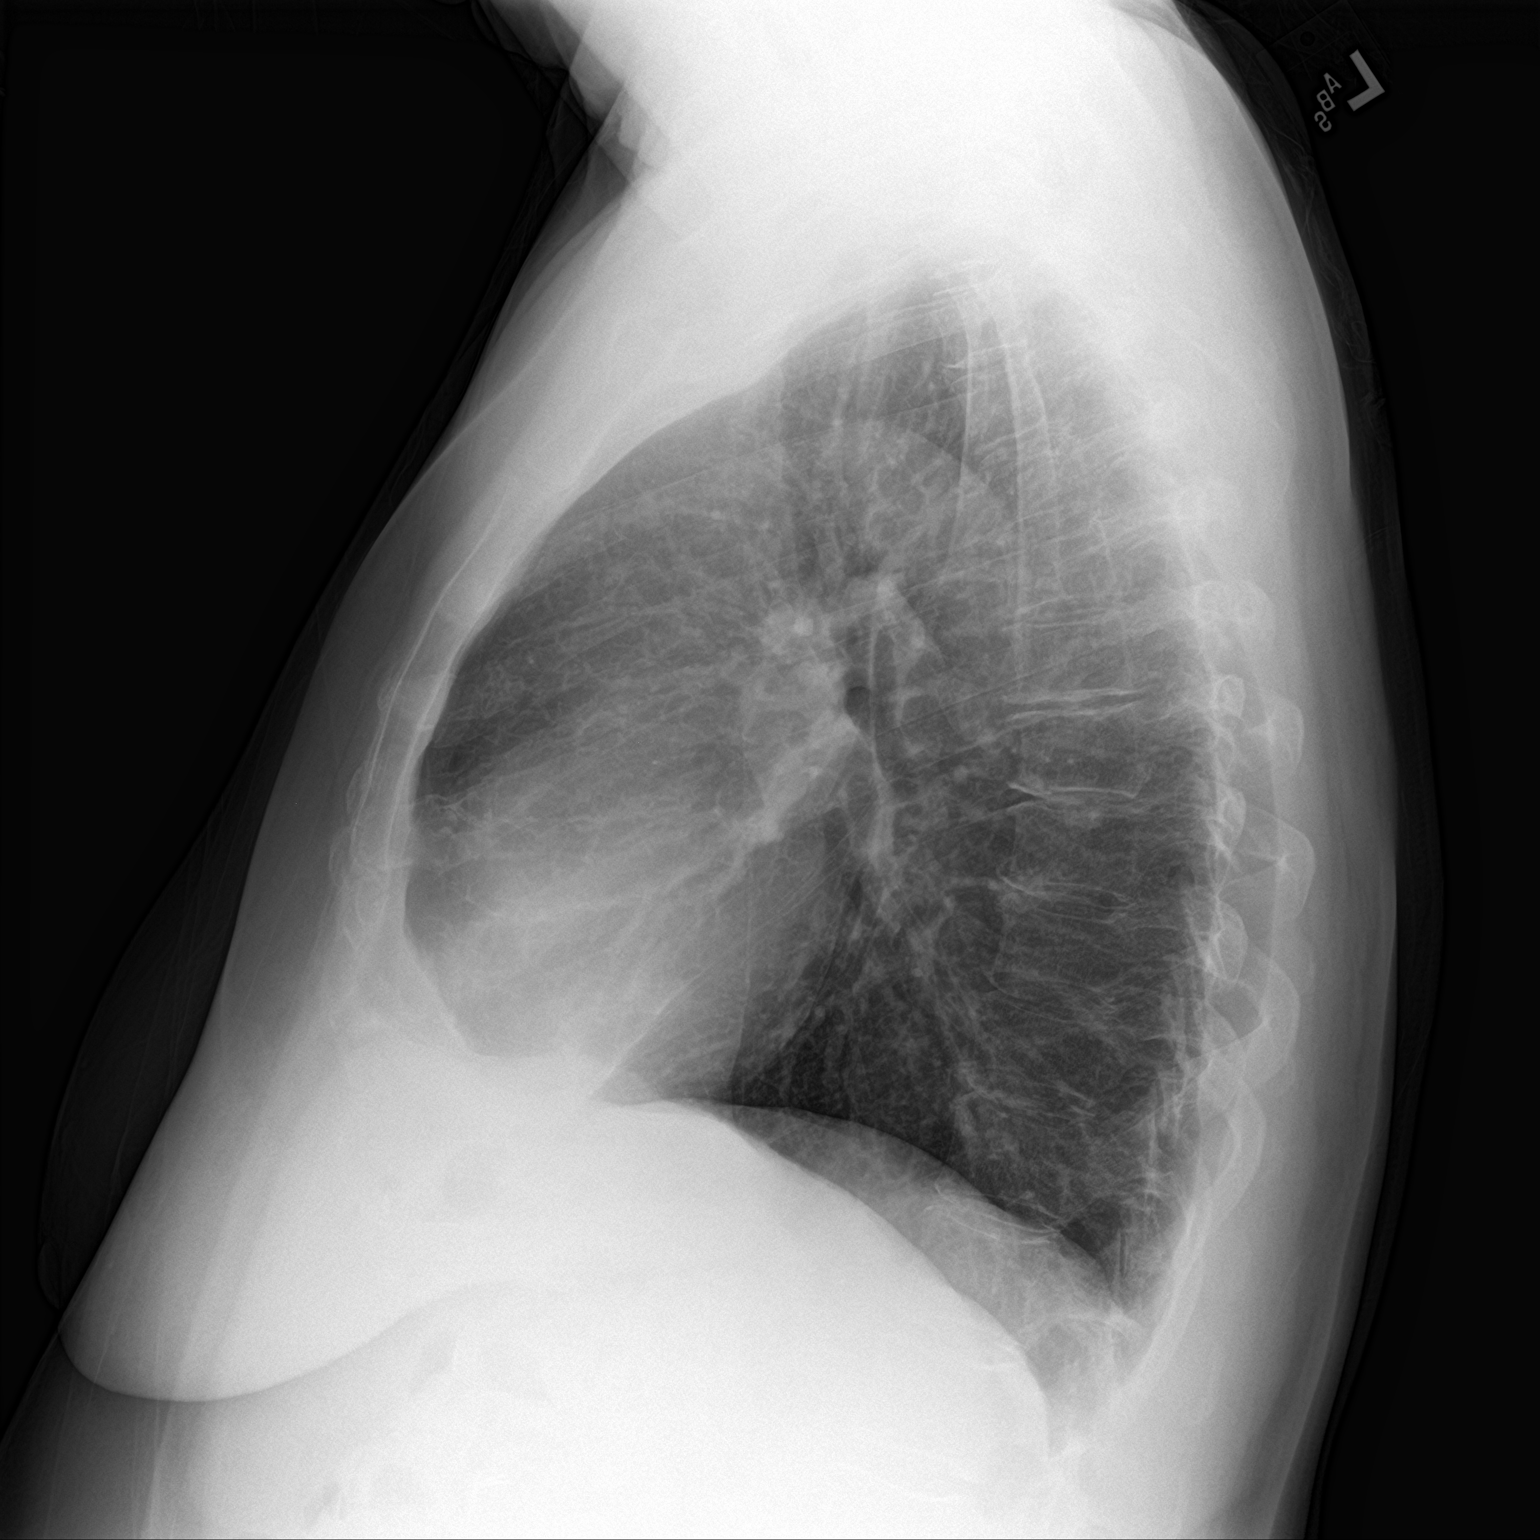

[2 of 2 positions shown; findings below may reference images not displayed]

FINDINGS: Lungs are clear. Heart size and pulmonary vascularity are normal. No
adenopathy. There is mid thoracic dextroscoliosis with thoracolumbar
levoscoliosis.
IMPRESSION: No edema or consolidation.

## 2017-10-06 ENCOUNTER — Encounter (HOSPITAL_BASED_OUTPATIENT_CLINIC_OR_DEPARTMENT_OTHER): Payer: Self-pay

## 2017-10-06 ENCOUNTER — Ambulatory Visit (HOSPITAL_BASED_OUTPATIENT_CLINIC_OR_DEPARTMENT_OTHER): Admit: 2017-10-06 | Payer: 59 | Admitting: Plastic Surgery

## 2017-10-06 SURGERY — BREAST RECONSTRUCTION WITH PLACEMENT OF TISSUE EXPANDER AND FLEX HD (ACELLULAR HYDRATED DERMIS)
Anesthesia: General | Laterality: Left

## 2017-12-19 ENCOUNTER — Other Ambulatory Visit: Payer: Self-pay

## 2017-12-19 DIAGNOSIS — Z1231 Encounter for screening mammogram for malignant neoplasm of breast: Secondary | ICD-10-CM

## 2017-12-30 ENCOUNTER — Emergency Department
Admission: EM | Admit: 2017-12-30 | Discharge: 2017-12-30 | Disposition: A | Discharge status: Home / Self Care | Payer: Managed Care, Other (non HMO) | Attending: Emergency Medicine | Admitting: Emergency Medicine

## 2017-12-30 ENCOUNTER — Other Ambulatory Visit: Payer: Self-pay

## 2017-12-30 DIAGNOSIS — Z79899 Other long term (current) drug therapy: Secondary | ICD-10-CM | POA: Insufficient documentation

## 2017-12-30 DIAGNOSIS — E039 Hypothyroidism, unspecified: Secondary | ICD-10-CM | POA: Diagnosis not present

## 2017-12-30 DIAGNOSIS — Z853 Personal history of malignant neoplasm of breast: Secondary | ICD-10-CM | POA: Insufficient documentation

## 2017-12-30 DIAGNOSIS — R197 Diarrhea, unspecified: Secondary | ICD-10-CM | POA: Diagnosis not present

## 2017-12-30 DIAGNOSIS — R509 Fever, unspecified: Secondary | ICD-10-CM | POA: Diagnosis present

## 2017-12-30 DIAGNOSIS — R112 Nausea with vomiting, unspecified: Secondary | ICD-10-CM | POA: Diagnosis not present

## 2017-12-30 DIAGNOSIS — F17210 Nicotine dependence, cigarettes, uncomplicated: Secondary | ICD-10-CM | POA: Insufficient documentation

## 2017-12-30 DIAGNOSIS — J45909 Unspecified asthma, uncomplicated: Secondary | ICD-10-CM | POA: Insufficient documentation

## 2017-12-30 LAB — URINALYSIS, ROUTINE W REFLEX MICROSCOPIC
BILIRUBIN URINE: NEGATIVE
Bacteria, UA: NONE SEEN
Glucose, UA: >=500 mg/dL — AB
HGB URINE DIPSTICK: NEGATIVE
Ketones, ur: 5 mg/dL — AB
LEUKOCYTES UA: NEGATIVE
NITRITE: NEGATIVE
PH: 6 (ref 5.0–8.0)
Protein, ur: NEGATIVE mg/dL
SPECIFIC GRAVITY, URINE: 1.023 (ref 1.005–1.030)

## 2017-12-30 LAB — COMPREHENSIVE METABOLIC PANEL
ALT: 14 U/L (ref 14–54)
AST: 21 U/L (ref 15–41)
Albumin: 4.1 g/dL (ref 3.5–5.0)
Alkaline Phosphatase: 50 U/L (ref 38–126)
Anion gap: 11 (ref 5–15)
BILIRUBIN TOTAL: 0.4 mg/dL (ref 0.3–1.2)
BUN: 13 mg/dL (ref 6–20)
CO2: 25 mmol/L (ref 22–32)
Calcium: 9.3 mg/dL (ref 8.9–10.3)
Chloride: 106 mmol/L (ref 101–111)
Creatinine, Ser: 0.83 mg/dL (ref 0.44–1.00)
GFR calc Af Amer: >60 mL/min (ref >60)
Glucose, Bld: 108 mg/dL — ABNORMAL HIGH (ref 65–99)
POTASSIUM: 4.2 mmol/L (ref 3.5–5.1)
Sodium: 142 mmol/L (ref 135–145)
TOTAL PROTEIN: 7 g/dL (ref 6.5–8.1)

## 2017-12-30 LAB — CBC WITH DIFFERENTIAL/PLATELET
BASOS ABS: 0.1 10*3/uL (ref 0–0.1)
BASOS PCT: 1 %
EOS ABS: 0.3 10*3/uL (ref 0–0.7)
EOS PCT: 5 %
HEMATOCRIT: 40.6 % (ref 35.0–47.0)
Hemoglobin: 13.1 g/dL (ref 12.0–16.0)
Lymphocytes Relative: 25 %
Lymphs Abs: 1.4 10*3/uL (ref 1.0–3.6)
MCH: 24.7 pg — ABNORMAL LOW (ref 26.0–34.0)
MCHC: 32.3 g/dL (ref 32.0–36.0)
MCV: 76.6 fL — ABNORMAL LOW (ref 80.0–100.0)
MONO ABS: 0.4 10*3/uL (ref 0.2–0.9)
Monocytes Relative: 8 %
NEUTROS ABS: 3.6 10*3/uL (ref 1.4–6.5)
Neutrophils Relative %: 61 %
PLATELETS: 330 10*3/uL (ref 150–440)
RBC: 5.31 MIL/uL — ABNORMAL HIGH (ref 3.80–5.20)
RDW: 14.7 % — AB (ref 11.5–14.5)
WBC: 5.8 10*3/uL (ref 3.6–11.0)

## 2017-12-30 LAB — LIPASE, BLOOD: LIPASE: 29 U/L (ref 11–51)

## 2017-12-30 LAB — INFLUENZA PANEL BY PCR (TYPE A & B)
Influenza A By PCR: NEGATIVE
Influenza B By PCR: NEGATIVE

## 2017-12-30 MED ORDER — SODIUM CHLORIDE 0.9 % IV SOLN: INTRAVENOUS | Status: DC

## 2017-12-30 MED ORDER — SODIUM CHLORIDE 0.9 % IV BOLUS (SEPSIS)
1000.0000 mL | Freq: Once | INTRAVENOUS | Status: AC
  Administered 2017-12-30: 1000 mL via INTRAVENOUS

## 2017-12-30 MED ORDER — ONDANSETRON HCL 4 MG PO TABS: 4.0000 mg | ORAL_TABLET | Freq: Three times a day (TID) | ORAL | 0 refills | Status: AC | PRN

## 2017-12-30 MED ORDER — ONDANSETRON HCL 4 MG/2ML IJ SOLN
4.0000 mg | Freq: Once | INTRAMUSCULAR | Status: AC
  Administered 2017-12-30: 4 mg via INTRAVENOUS
  Filled 2017-12-30: qty 2

## 2017-12-30 NOTE — ED Notes (Signed)
Pt PO challenged per MD request. Will wait to see if patient can tolerate PO.

## 2017-12-30 NOTE — Discharge Instructions (Signed)
If you have any bleeding, severe pain, you feel dehydrated, persistent vomiting, any pain of any concern, or any other new or worrisome symptoms please return to the emergency room.  Hopefully this will not last much longer.  Drink plenty of fluids take the antinausea medications as prescribed.  If you have any concerns please return

## 2017-12-30 NOTE — ED Triage Notes (Signed)
Patient here from work complaining of N&V, Fever 103 yesterday AM.  Abdominal pain - "kind of radiates from middle down to bottom".  SX began 3 days ago with N&V, then diarrhea.  Denies exposure to same.

## 2017-12-30 NOTE — ED Provider Notes (Addendum)
[facility] Emergency Department Provider Note  ____________________________________________   I have reviewed the triage vital signs and the nursing notes. Where available I have reviewed prior notes and, if possible and indicated, outside hospital notes.    HISTORY  Chief Complaint Abdominal Pain; Emesis; Nausea; and Diarrhea    HPI Erin Tate is a 57 y.o. female with a history of multiple different abdominal surgeries including hysterectomy, gastric bypass, all remotely.  Presents today complaining of fever which began 3 days ago and rapidly went away, as well as nausea and vomiting which began a few days ago and now is better, and diarrhea which started yesterday which persists.  The patient states she is able to take p.o. but "goes right through her now".  She states that she did have a slight headache when the fever started but that is gone.  She states that she has had no stiff neck.  She denies any melena bright red blood per rectum or hematemesis.  She states she is drinking at this time but again it seems to result in emesis and diarrhea.  It is a watery diarrhea.  No recent travel no recent antibiotics.  Very large community burden of viral enteritis at this time.  Patient states that she has occasional cramping discomfort in her abdomen that seems to be relieved by and associated with diarrhea.  It is a cramping discomfort which is everywhere no focal pain.    Past Medical History:  Diagnosis Date  . Asthma   . Breast cancer ([place]) 1996   LEFT MASTECTOMY DCIS  . Diabetes mellitus without complication ([place])   . Headache   . Heart murmur   . High cholesterol   . Hypothyroidism   . IDA (iron deficiency anemia)   . Osteoarthritis    h/o Reclast injection  . Pneumonia   . Thyroid condition 1991  . Upper GI bleed 2014    Patient Active Problem List   Diagnosis Date Noted  . Other iron deficiency anemia 02/02/2016  . Anastomotic ulcer S/P  gastric bypass 08/21/2015  . Encounter for screening colonoscopy 08/21/2015  . 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 Abdominal pain, epigastric 09/03/2014  . Personal history of malignant neoplasm of breast 01/09/2014  . Breast cancer in situ 01/08/2013    Past Surgical History:  Procedure Laterality Date  . ABDOMINAL HYSTERECTOMY  1997  . BREAST BIOPSY Right 2003   neg  . CARPAL TUNNEL RELEASE Bilateral [id number]  . COLONOSCOPY  2006   Dr. Bary Castilla, hyperplastic polyp of the rectum. Diverticulosis.  . COLONOSCOPY WITH PROPOFOL N/A 10/08/2015   Procedure: COLONOSCOPY WITH PROPOFOL;  Surgeon: Robert Bellow, MD;  Location: [facility] ENDOSCOPY;  Service: Endoscopy;  Laterality: N/A;  . ESOPHAGOGASTRODUODENOSCOPY (EGD) WITH PROPOFOL N/A 10/08/2015   Procedure: ESOPHAGOGASTRODUODENOSCOPY (EGD) WITH PROPOFOL;  Surgeon: Robert Bellow, MD;  Location: [facility] ENDOSCOPY;  Service: Endoscopy;  Laterality: N/A;  . GASTRIC BYPASS  2010  . [place]  . KNEE SURGERY Right 2011  . MASTECTOMY Left 1996   DCIS  . NECK SURGERY  04/2013  . subclavical osteoarthritis removal    . TONSILLECTOMY  1984  . UPPER GI ENDOSCOPY  2010, 2014, 2015, 2016    Prior to Admission medications   Medication Sig Start Date End Date Taking? Authorizing Provider  atorvastatin (LIPITOR) 20 MG tablet TK 1 T PO QD 11/16/15   [provider]  Canagliflozin (INVOKANA) 300 MG TABS Take 300 mg by mouth daily.  [provider]  DULoxetine (CYMBALTA) 60 MG capsule Take 60 mg by mouth daily.    [provider]  ergocalciferol (VITAMIN D2) 50000 UNITS capsule Take 50,000 Units by mouth once a week.    [provider]  levothyroxine (SYNTHROID, LEVOTHROID) 150 MCG tablet Take 150 mcg by mouth daily.    [provider]  metFORMIN (GLUMETZA) 500 MG (MOD) 24 hr tablet Take 500 mg by mouth daily with breakfast.    [provider]  Multiple Vitamin (MULTIVITAMIN) tablet Take 1  tablet by mouth daily.    [provider]  omeprazole (PRILOSEC) 40 MG capsule Take 40 mg by mouth daily.    [provider]  PROAIR HFA 108 [phone] Base) MCG/ACT inhaler  12/02/15   [provider]  rosuvastatin (CRESTOR) 20 MG tablet Take 20 mg by mouth daily.    [provider]    Allergies Patient has no known allergies.  Family History  Problem Relation Age of Onset  . Lung cancer Mother   . Hodgkin's lymphoma Father   . Breast cancer Paternal Aunt     Social History Social History   Tobacco Use  . Smoking status: Current Some Day Smoker    Packs/day: 1.00    Years: 15.00    Pack years: 15.00    Types: E-cigarettes  . Smokeless tobacco: Never Used  Substance Use Topics  . Alcohol use: No  . Drug use: No    Review of Systems Constitutional: No fever/chills Eyes: No visual changes. ENT: No sore throat. No stiff neck no neck pain Cardiovascular: Denies chest pain. Respiratory: Denies shortness of breath. Gastrointestinal: See HPI genitourinary: Negative for dysuria. Musculoskeletal: Negative lower extremity swelling Skin: Negative for rash. Neurological: Negative for severe headaches, focal weakness or numbness.   ____________________________________________   PHYSICAL EXAM:  VITAL SIGNS: ED Triage Vitals  Enc Vitals Group     BP 12/30/17 0749 (!) 156/98     Pulse Rate 12/30/17 0749 76     Resp 12/30/17 0749 16     Temp 12/30/17 0749 98 F (36.7 C)     Temp Source 12/30/17 0749 Oral     SpO2 12/30/17 0749 99 %     Weight 12/30/17 0750 190 lb (86.2 kg)     Height 12/30/17 0750 5\' 8"  (1.727 m)     Head Circumference --      Peak Flow --      Pain Score 12/30/17 0750 7     Pain Loc --      Pain Edu? --      Excl. in [place]? --     Constitutional: Alert and oriented. Well appearing and in no acute distress. Eyes: Conjunctivae are normal Head: Atraumatic HEENT: No congestion/rhinnorhea. Mucous membranes are somewhat dry.   Oropharynx non-erythematous Neck:   Nontender with no meningismus, no masses, no stridor Cardiovascular: Normal rate, regular rhythm. Grossly normal heart sounds.  Good peripheral circulation. Respiratory: Normal respiratory effort.  No retractions. Lungs CTAB. Abdominal: Soft and nontender. No distention. No guarding no rebound Back:  There is no focal tenderness or step off.  there is no midline tenderness there are no lesions noted. there is no CVA tenderness Musculoskeletal: No lower extremity tenderness, no upper extremity tenderness. No joint effusions, no DVT signs strong distal pulses no edema Neurologic:  Normal speech and language. No gross focal neurologic deficits are appreciated.  Skin:  Skin is warm, dry and intact. No rash noted. Psychiatric: Mood and  affect are normal. Speech and behavior are normal.  ____________________________________________   LABS (all labs ordered are listed, but only abnormal results are displayed)  Labs Reviewed  COMPREHENSIVE METABOLIC PANEL  LIPASE, BLOOD  CBC WITH DIFFERENTIAL/PLATELET  URINALYSIS, ROUTINE W REFLEX MICROSCOPIC  INFLUENZA PANEL BY PCR (TYPE A & B)    Pertinent labs  results that were available during my care of the patient were reviewed by me and considered in my medical decision making (see chart for details). ____________________________________________  EKG  I personally interpreted any EKGs ordered by me or triage  ____________________________________________  RADIOLOGY  Pertinent labs & imaging results that were available during my care of the patient were reviewed by me and considered in my medical decision making (see chart for details). If possible, patient and/or family made aware of any abnormal findings.  No results found. ____________________________________________    PROCEDURES  Procedure(s) performed: None  Procedures  Critical Care performed:  None  ____________________________________________   INITIAL IMPRESSION / ASSESSMENT AND PLAN / ED COURSE  Pertinent labs & imaging results that were available during my care of the patient were reviewed by me and considered in my medical decision making (see chart for details).  Patient with nausea vomiting and diarrhea sequentially over the last 3 days with fever at the beginning.  All of this is quite consistent with a viral GI bug and her abdomen is nonsurgical at this time despite 4 days of symptoms.  It is my hope that hydration should be sufficient to get her feeling better.  She is already able to self hydrate.  Her abdomen does not show any focal tenderness or surgical signs consistent with appendicitis, and there is no evidence of bowel obstruction or abscess or any possible likely complication of her gastric bypass surgery at least clinically thus far.  ----------------------------------------- 9:17 AM on 12/30/2017 -----------------------------------------  Seen in no distress, feeling much better abdomen benign no pain no tenderness, blood work reassuring vital signs reassuring, she is in no acute distress    ____________________________________________   FINAL CLINICAL IMPRESSION(S) / ED DIAGNOSES  Final diagnoses:  None      This chart was dictated using voice recognition software.  Despite best efforts to proofread,  errors can occur which can change meaning.      Schuyler Amor, MD 12/30/17 [id number]    Schuyler Amor, MD 12/30/17 [phone]

## 2017-12-30 NOTE — ED Notes (Signed)
NAD noted at time of D/C. Pt denies questions or concerns. Pt ambulatory to the lobby at this time.  

## 2018-01-26 ENCOUNTER — Ambulatory Visit: Payer: 59 | Admitting: General Surgery

## 2018-03-03 ENCOUNTER — Other Ambulatory Visit: Payer: Self-pay

## 2018-04-18 ENCOUNTER — Ambulatory Visit
Admission: RE | Admit: 2018-04-18 | Discharge: 2018-04-18 | Disposition: A | Discharge status: Home / Self Care | Payer: Managed Care, Other (non HMO) | Source: Ambulatory Visit | Attending: General Surgery | Admitting: General Surgery

## 2018-04-18 DIAGNOSIS — Z1231 Encounter for screening mammogram for malignant neoplasm of breast: Secondary | ICD-10-CM | POA: Diagnosis not present

## 2018-04-27 ENCOUNTER — Ambulatory Visit: Payer: Managed Care, Other (non HMO) | Admitting: General Surgery

## 2018-04-27 ENCOUNTER — Encounter: Payer: Self-pay | Admitting: General Surgery

## 2018-04-27 VITALS — BP 140/84 | HR 74 | Resp 14 | Ht 67.0 in | Wt 194.0 lb

## 2018-04-27 DIAGNOSIS — Z853 Personal history of malignant neoplasm of breast: Secondary | ICD-10-CM

## 2018-04-27 NOTE — Patient Instructions (Addendum)
Patient will be asked to return to the office in one year with a right breast screening mammogram. The patient is aware to call back for any questions or concerns.

## 2018-04-27 NOTE — Progress Notes (Signed)
Patient ID: Erin Tate, female   DOB: 06/02/1961, 57 y.o.   MRN: [id number]  Chief Complaint  Patient presents with  . Follow-up    HPI Erin Tate is a 57 y.o. female who presents for a breast evaluation. The most recent right screening mammogram was done on 04/05/2018 .  Patient does perform regular self breast checks and gets regular mammograms done.   The patient did meet with Audelia Hives, DO last fall about possible breast reconstruction, but has decided against it.  She reported that she had a lot of surgery that she had to have, and was not too sure if she wanted to have a procedure just because she could.  HPI  Past Medical History:  Diagnosis Date  . Asthma   . Breast cancer ([place]) 1996   LEFT MASTECTOMY DCIS  . Diabetes mellitus without complication ([place])   . Headache   . Heart murmur   . High cholesterol   . Hypothyroidism   . IDA (iron deficiency anemia)   . Osteoarthritis    h/o Reclast injection  . Pneumonia   . Thyroid condition 1991  . Upper GI bleed 2014    Past Surgical History:  Procedure Laterality Date  . ABDOMINAL HYSTERECTOMY  1997  . BREAST BIOPSY Right 2003   neg  . CARPAL TUNNEL RELEASE Bilateral [id number]  . COLONOSCOPY  2006   Dr. Bary Castilla, hyperplastic polyp of the rectum. Diverticulosis.  . COLONOSCOPY WITH PROPOFOL N/A 10/08/2015   Procedure: COLONOSCOPY WITH PROPOFOL;  Surgeon: Robert Bellow, MD;  Location: [facility] ENDOSCOPY;  Service: Endoscopy;  Laterality: N/A;  . ESOPHAGOGASTRODUODENOSCOPY (EGD) WITH PROPOFOL N/A 10/08/2015   Procedure: ESOPHAGOGASTRODUODENOSCOPY (EGD) WITH PROPOFOL;  Surgeon: Robert Bellow, MD;  Location: [facility] ENDOSCOPY;  Service: Endoscopy;  Laterality: N/A;  . GASTRIC BYPASS  2010  . [place]  . KNEE SURGERY Right 2011  . MASTECTOMY Left 1996   DCIS  . NECK SURGERY  04/2013  . OOPHORECTOMY    . subclavical osteoarthritis removal    . TONSILLECTOMY  1984  . UPPER GI  ENDOSCOPY  2010, 2014, 2015, 2016    Family History  Problem Relation Age of Onset  . Lung cancer Mother   . Hodgkin's lymphoma Father   . Breast cancer Paternal Aunt     Social History Social History   Tobacco Use  . Smoking status: Current Some Day Smoker    Packs/day: 1.00    Years: 15.00    Pack years: 15.00    Types: E-cigarettes  . Smokeless tobacco: Never Used  Substance Use Topics  . Alcohol use: No  . Drug use: No    No Known Allergies  Current Outpatient Medications  Medication Sig Dispense Refill  . atorvastatin (LIPITOR) 20 MG tablet Take 20 mg by mouth daily.    . canagliflozin (INVOKANA) 300 MG TABS tablet Take 300 mg by mouth daily.    . DULoxetine (CYMBALTA) 60 MG capsule Take 60 mg by mouth 2 (two) times daily.     . ergocalciferol (VITAMIN D2) 50000 UNITS capsule Take 50,000 Units by mouth every Friday.     . levothyroxine (SYNTHROID, LEVOTHROID) 125 MCG tablet Take 125 mcg by mouth daily.     . metFORMIN (GLUCOPHAGE-XR) 500 MG 24 hr tablet Take 500 mg by mouth daily with breakfast.    . omeprazole (PRILOSEC) 40 MG capsule Take 40 mg by mouth daily.    . ondansetron (ZOFRAN) 4 MG tablet Take  1 tablet (4 mg total) by mouth every 8 (eight) hours as needed for nausea or vomiting. 8 tablet 0   No current facility-administered medications for this visit.     Review of Systems Review of Systems  Constitutional: Negative.   Respiratory: Negative.   Cardiovascular: Negative.     Blood pressure 140/84, pulse 74, resp. rate 14, height 5\' 7"  (1.702 m), weight 194 lb (88 kg).  Physical Exam Physical Exam  Constitutional: She is oriented to person, place, and time. She appears well-developed and well-nourished.  Eyes: Conjunctivae are normal. No scleral icterus.  Neck: Neck supple.  Cardiovascular: Normal rate, regular rhythm and normal heart sounds.  Pulmonary/Chest: Effort normal and breath sounds normal. Right breast exhibits no inverted nipple, no  mass, no nipple discharge, no skin change and no tenderness.  Left breast mastectomy site is clean and well healed.     Lymphadenopathy:    She has no cervical adenopathy.    She has no axillary adenopathy.  Neurological: She is alert and oriented to person, place, and time.  Skin: Skin is warm and dry.    Data Reviewed Right breast screening mammogram dated April 05, 2018 was reviewed.  BI-RADS-1.  Assessment    No evidence of active breast disease.    Plan  Patient will be asked to return to the office in one year with a right breast screening mammogram. The patient is aware to call back for any questions or concerns.  HPI, Physical Exam, Assessment and Plan have been scribed under the direction and in the presence of Hervey Ard, MD.  Gaspar Cola, CMA  I have completed the exam and reviewed the above documentation for accuracy and completeness.  I agree with the above.  Haematologist has been used and any errors in dictation or transcription are unintentional.  Hervey Ard, M.D., F.A.C.S.  Forest Gleason Eugune Sine 04/28/2018, 6:19 AM

## 2018-05-11 ENCOUNTER — Ambulatory Visit: Payer: Managed Care, Other (non HMO)

## 2019-04-16 ENCOUNTER — Other Ambulatory Visit: Payer: Self-pay | Admitting: *Deleted

## 2019-04-16 DIAGNOSIS — Z1231 Encounter for screening mammogram for malignant neoplasm of breast: Secondary | ICD-10-CM

## 2019-05-31 ENCOUNTER — Ambulatory Visit: Payer: Managed Care, Other (non HMO) | Admitting: General Surgery

## 2019-08-07 ENCOUNTER — Encounter: Payer: Self-pay | Admitting: *Deleted

## 2021-03-10 ENCOUNTER — Other Ambulatory Visit: Payer: Self-pay | Admitting: General Surgery

## 2021-03-10 DIAGNOSIS — Z1231 Encounter for screening mammogram for malignant neoplasm of breast: Secondary | ICD-10-CM

## 2021-03-17 ENCOUNTER — Ambulatory Visit
Admission: RE | Admit: 2021-03-17 | Discharge: 2021-03-17 | Disposition: A | Discharge status: Home / Self Care | Payer: BC Managed Care – PPO | Source: Ambulatory Visit | Attending: General Surgery | Admitting: General Surgery

## 2021-03-17 ENCOUNTER — Encounter: Payer: Self-pay | Admitting: Internal Medicine

## 2021-03-17 ENCOUNTER — Other Ambulatory Visit: Payer: Self-pay

## 2021-03-17 DIAGNOSIS — Z1231 Encounter for screening mammogram for malignant neoplasm of breast: Secondary | ICD-10-CM | POA: Diagnosis present

## 2022-02-12 ENCOUNTER — Other Ambulatory Visit: Payer: Self-pay | Admitting: General Surgery

## 2022-02-12 DIAGNOSIS — Z1231 Encounter for screening mammogram for malignant neoplasm of breast: Secondary | ICD-10-CM

## 2022-03-22 ENCOUNTER — Ambulatory Visit
Admission: RE | Admit: 2022-03-22 | Discharge: 2022-03-22 | Disposition: A | Discharge status: Home / Self Care | Payer: BC Managed Care – PPO | Source: Ambulatory Visit | Attending: General Surgery | Admitting: General Surgery

## 2022-03-22 DIAGNOSIS — Z1231 Encounter for screening mammogram for malignant neoplasm of breast: Secondary | ICD-10-CM | POA: Insufficient documentation

## 2023-03-16 ENCOUNTER — Other Ambulatory Visit: Payer: Self-pay | Admitting: General Surgery

## 2023-03-16 DIAGNOSIS — Z1231 Encounter for screening mammogram for malignant neoplasm of breast: Secondary | ICD-10-CM

## 2023-04-26 ENCOUNTER — Ambulatory Visit
Admission: RE | Admit: 2023-04-26 | Discharge: 2023-04-26 | Disposition: A | Discharge status: Home / Self Care | Payer: BC Managed Care – PPO | Source: Ambulatory Visit | Attending: General Surgery | Admitting: General Surgery

## 2023-04-26 DIAGNOSIS — Z1231 Encounter for screening mammogram for malignant neoplasm of breast: Secondary | ICD-10-CM | POA: Insufficient documentation

## 2023-08-11 LAB — LAB REPORT - SCANNED
A1c: 5.7
EGFR: 104

## 2023-08-22 ENCOUNTER — Inpatient Hospital Stay: Payer: BC Managed Care – PPO | Attending: Internal Medicine | Admitting: Internal Medicine

## 2023-08-22 ENCOUNTER — Telehealth: Payer: Self-pay | Admitting: Internal Medicine

## 2023-08-22 ENCOUNTER — Encounter: Payer: Self-pay | Admitting: Internal Medicine

## 2023-08-22 ENCOUNTER — Inpatient Hospital Stay: Payer: BC Managed Care – PPO

## 2023-08-22 VITALS — BP 118/79 | HR 57 | Temp 97.9°F | Wt 148.0 lb

## 2023-08-22 DIAGNOSIS — E119 Type 2 diabetes mellitus without complications: Secondary | ICD-10-CM

## 2023-08-22 DIAGNOSIS — R519 Headache, unspecified: Secondary | ICD-10-CM | POA: Diagnosis not present

## 2023-08-22 DIAGNOSIS — Z86000 Personal history of in-situ neoplasm of breast: Secondary | ICD-10-CM | POA: Diagnosis not present

## 2023-08-22 DIAGNOSIS — R5383 Other fatigue: Secondary | ICD-10-CM

## 2023-08-22 DIAGNOSIS — D509 Iron deficiency anemia, unspecified: Secondary | ICD-10-CM | POA: Diagnosis present

## 2023-08-22 DIAGNOSIS — E538 Deficiency of other specified B group vitamins: Secondary | ICD-10-CM | POA: Insufficient documentation

## 2023-08-22 DIAGNOSIS — F419 Anxiety disorder, unspecified: Secondary | ICD-10-CM

## 2023-08-22 DIAGNOSIS — D508 Other iron deficiency anemias: Secondary | ICD-10-CM

## 2023-08-22 DIAGNOSIS — Z9884 Bariatric surgery status: Secondary | ICD-10-CM | POA: Diagnosis not present

## 2023-08-22 DIAGNOSIS — D649 Anemia, unspecified: Secondary | ICD-10-CM

## 2023-08-22 MED ORDER — CYANOCOBALAMIN 1000 MCG/ML IJ SOLN: 1000.0000 ug | Freq: Once | INTRAMUSCULAR | 0 refills | Status: AC

## 2023-08-22 NOTE — Progress Notes (Signed)
[facility]  [phone] [phone]  ID: Ortencia Kick OB: 1961/10/04  MR#: [id number]  NGE#:[id number]  Patient Care Team: Alan Mulder, MD as PCP - General (Radiology) Lemar Livings, Merrily Pew, MD (General Surgery) Patient, No Pcp Per (General Practice) Michaelyn Barter, MD as Consulting Physician (Oncology) morayat REFERRING PROVIDER: Horton Chin, MD  REASON FOR REFERRAL: Iron deficiency anemia  HPI:  Erin Tate is a 62 y.o. female with past medical history of left breast DCIS status postmastectomy 1996, IDA, gastric bypass surgery, history of GI bleed, gastric ulcer, diabetes, hyperlipidemia, anxiety referred to hematology for iron deficiency anemia management.  Patient reports worsening fatigue over past 6 months.  She has stopped work schedule 70 hours a week.  Having headaches.  Denies any bleeding in urine or stool.  She was previously seen by Dr. Donneta Romberg and received IV Feraheme in 2017.  History of gastric bypass surgery.  History of GI bleed from gastric ulcer.  Her last colonoscopy was in 2016 which was unremarkable.  Repeat was recommended in 10 years. Has previously taken iron pills causes severe constipation.  Had lab work done with her primary couple of weeks ago.  She was able to give me the report from [facility] portal.  Hemoglobin of 11.6, ferritin 5 and saturation 6%.  Folate 6.5 and B12 220.  She is on B12 supplements 1000 mcg daily.  REVIEW OF SYSTEMS:   ROS  As per HPI. Otherwise, a complete review of systems is negative.  PAST MEDICAL HISTORY: Past Medical History:  Diagnosis Date   Asthma    Breast cancer (HCC) 1996   LEFT MASTECTOMY DCIS   Diabetes mellitus without complication (HCC)    Headache    Heart murmur    High cholesterol    Hypothyroidism    IDA (iron deficiency anemia)    Osteoarthritis    h/o Reclast injection   Pneumonia    Thyroid condition 1991   Upper GI bleed 2014     PAST SURGICAL HISTORY: Past Surgical History:  Procedure Laterality Date   ABDOMINAL HYSTERECTOMY  1997   BREAST BIOPSY Right 2003   neg   CARPAL TUNNEL RELEASE Bilateral [id number]   COLONOSCOPY  2006   Dr. Lemar Livings, hyperplastic polyp of the rectum. Diverticulosis.   COLONOSCOPY WITH PROPOFOL N/A 10/08/2015   Procedure: COLONOSCOPY WITH PROPOFOL;  Surgeon: [name], MD;  Location: [facility] ENDOSCOPY;  Service: Endoscopy;  Laterality: N/A;   ESOPHAGOGASTRODUODENOSCOPY (EGD) WITH PROPOFOL N/A 10/08/2015   Procedure: ESOPHAGOGASTRODUODENOSCOPY (EGD) WITH PROPOFOL;  Surgeon: [name], MD;  Location: [facility] ENDOSCOPY;  Service: Endoscopy;  Laterality: N/A;   GASTRIC BYPASS  2010   HEMORRHOID SURGERY  1983   KNEE SURGERY Right 2011   MASTECTOMY Left 1996   DCIS   NECK SURGERY  04/2013   OOPHORECTOMY     subclavical osteoarthritis removal     TONSILLECTOMY  1984   UPPER GI ENDOSCOPY  2010, 2014, 2015, 2016    FAMILY HISTORY: Family History  Problem Relation Age of Onset   Lung cancer Mother    Hodgkin's lymphoma Father    Breast cancer Paternal Aunt     HEALTH MAINTENANCE: Social History   Tobacco Use   Smoking status: Some Days    Current packs/day: 1.00    Average packs/day: 1 pack/day for 15.0 years (15.0 ttl pk-yrs)    Types: E-cigarettes, Cigarettes   Smokeless tobacco: Never  Substance Use Topics   Alcohol use: No  Drug use: No     No Known Allergies  Current Outpatient Medications  Medication Sig Dispense Refill   cyanocobalamin (VITAMIN B12) 1000 MCG/ML injection Inject 1 mL (1,000 mcg total) into the muscle once for 1 dose. 1 mL 0   ergocalciferol (VITAMIN D2) 50000 UNITS capsule Take 50,000 Units by mouth every Friday.      levothyroxine (SYNTHROID, LEVOTHROID) 125 MCG tablet Take 125 mcg by mouth daily.      Multiple Vitamin (MULTI-VITAMIN) tablet Take by mouth.     omeprazole (PRILOSEC) 40 MG capsule Take 40 mg by mouth daily.      ondansetron (ZOFRAN) 4 MG tablet Take 1 tablet (4 mg total) by mouth every 8 (eight) hours as needed for nausea or vomiting. 8 tablet 0   tirzepatide (MOUNJARO) 15 MG/0.5ML Pen See admin instructions.     venlafaxine (EFFEXOR) 37.5 MG tablet Take 37.5 mg by mouth 2 (two) times daily.     atorvastatin (LIPITOR) 20 MG tablet Take 20 mg by mouth daily. (Patient not taking: Reported on 08/22/2023)     canagliflozin (INVOKANA) 300 MG TABS tablet Take 300 mg by mouth daily. (Patient not taking: Reported on 08/22/2023)     DULoxetine (CYMBALTA) 60 MG capsule Take 60 mg by mouth 2 (two) times daily.  (Patient not taking: Reported on 08/22/2023)     metFORMIN (GLUCOPHAGE-XR) 500 MG 24 hr tablet Take 500 mg by mouth daily with breakfast. (Patient not taking: Reported on 08/22/2023)     No current facility-administered medications for this visit.    OBJECTIVE: Vitals:   08/22/23 1134  BP: 118/79  Pulse: (!) 57  Temp: 97.9 F (36.6 C)  SpO2: 100%     Body mass index is 23.18 kg/m.      General: Well-developed, well-nourished, no acute distress. Eyes: Pink conjunctiva, anicteric sclera. HEENT: Normocephalic, moist mucous membranes, clear oropharnyx. Lungs: Clear to auscultation bilaterally. Heart: Regular rate and rhythm. No rubs, murmurs, or gallops. Abdomen: Soft, nontender, nondistended. No organomegaly noted, normoactive bowel sounds. Musculoskeletal: No edema, cyanosis, or clubbing. Neuro: Alert, answering all questions appropriately. Cranial nerves grossly intact. Skin: No rashes or petechiae noted. Psych: Normal affect. Lymphatics: No cervical, calvicular, axillary or inguinal LAD.   LAB RESULTS:  Lab Results  Component Value Date   NA 142 12/30/2017   K 4.2 12/30/2017   CL 106 12/30/2017   CO2 25 12/30/2017   GLUCOSE 108 (H) 12/30/2017   BUN 13 12/30/2017   CREATININE 0.83 12/30/2017   CALCIUM 9.3 12/30/2017   PROT 7.0 12/30/2017   ALBUMIN 4.1 12/30/2017   AST 21 12/30/2017    ALT 14 12/30/2017   ALKPHOS 50 12/30/2017   BILITOT 0.4 12/30/2017   GFRNONAA >60 12/30/2017   GFRAA >60 12/30/2017    Lab Results  Component Value Date   WBC 5.8 12/30/2017   NEUTROABS 3.6 12/30/2017   HGB 13.1 12/30/2017   HCT 40.6 12/30/2017   MCV 76.6 (L) 12/30/2017   PLT 330 12/30/2017    Lab Results  Component Value Date   TIBC 482 (H) 09/06/2016   TIBC 483 (H) 05/28/2016   TIBC 595 (H) 02/02/2016   FERRITIN 8 (L) 09/06/2016   FERRITIN 5 (L) 05/28/2016   FERRITIN 2 (L) 02/02/2016   IRONPCTSAT 24 09/06/2016   IRONPCTSAT 6 (L) 05/28/2016   IRONPCTSAT 1 (L) 02/02/2016     STUDIES: No results found.  ASSESSMENT AND PLAN:   Emmilia Sowder is a 62 y.o. female with pmh  of  left breast DCIS status postmastectomy 1996, IDA, gastric bypass surgery, history of GI bleed, gastric ulcer, diabetes, hyperlipidemia, anxiety referred to hematology for iron deficiency anemia management.  # Iron deficiency anemia # History of gastric bypass surgery in 2010 -Progressive.  Differential includes malabsorption from gastric bypass.  Cannot rule out occult bleed.  Has previously taken iron pills causes severe constipation. Patient reports worsening fatigue over past 6 months. Denies any bleeding in urine or stool.  She was previously seen by Dr. Donneta Romberg and received IV Feraheme in 2017.  History of gastric bypass surgery.  History of GI bleed from gastric ulcer.  - Had lab work done with her primary couple of weeks ago.  She was able to give me the report from [facility] portal.  Hemoglobin of 11.6, ferritin 5 and saturation 6%.  Folate 6.5 and B12 220.  She is on B12 supplements 1000 mcg daily.  Will schedule her for IV Feraheme weekly x 2 doses.  Previously tolerated well.  Occasional side effects such as nausea, chest pain, back pain and allergic reaction discussed.  -last colonoscopy was in 2016 which was unremarkable.  Referral to GI has been placed by her primary for repeat  endoscopy and colonoscopy.  # Borderline low B12 level -B12 from October 2024 220.  Continue with B12 supplement 1000 mcg daily -Will start her on maintenance B12 IM injections every 2 months.  She will get first injection with her iron.  Patient has prior experience with injecting allergy shots and insulin and feels comfortable self injecting at home.  Will send supplies.  Orders Placed This Encounter  Procedures   CBC with Differential (Cancer Center Only)   CMP (Cancer Center only)   Iron and TIBC([facility])   Ferritin   Vitamin B12   Schedule for IV Feraheme weekly x 2 doses RTC in 4 months for MD visit, labs, possible Feraheme.  Patient expressed understanding and was in agreement with this plan. She also understands that She can call clinic at any time with any questions, concerns, or complaints.   I spent a total of 45 minutes reviewing chart data, face-to-face evaluation with the patient, counseling and coordination of care as detailed above.  Michaelyn Barter, MD   08/22/2023 12:54 PM

## 2023-08-22 NOTE — Addendum Note (Signed)
Addended byMichaelyn Barter on: 08/22/2023 04:00 PM   Modules accepted: Orders

## 2023-08-22 NOTE — Telephone Encounter (Signed)
Per secure chat Anders Grant stated: [place] feraheme is non preferred. venofer and infed are the preferred. can this be changed?    Dr.Agrawal stated:Can we please switch her to IV Venofer weekly x 5 doses. Her insurance is not covering Feraheme.    Appt notes were changed and 3 more iron infusion appts were added.  I left a vm for pt stating this information and the added dates/time and our callback number if appts needed to be changed per pt schedule

## 2023-09-05 ENCOUNTER — Inpatient Hospital Stay: Payer: BC Managed Care – PPO

## 2023-09-05 ENCOUNTER — Encounter: Payer: Self-pay | Admitting: Internal Medicine

## 2023-09-05 VITALS — BP 118/75 | HR 56 | Temp 96.6°F | Resp 20

## 2023-09-05 DIAGNOSIS — D508 Other iron deficiency anemias: Secondary | ICD-10-CM

## 2023-09-05 DIAGNOSIS — D509 Iron deficiency anemia, unspecified: Secondary | ICD-10-CM | POA: Diagnosis not present

## 2023-09-05 MED ORDER — SODIUM CHLORIDE 0.9% FLUSH
10.0000 mL | Freq: Two times a day (BID) | INTRAVENOUS | Status: DC
  Administered 2023-09-05: 10 mL via INTRAVENOUS
  Filled 2023-09-05: qty 10

## 2023-09-05 MED ORDER — IRON SUCROSE 20 MG/ML IV SOLN
200.0000 mg | Freq: Once | INTRAVENOUS | Status: AC
  Administered 2023-09-05: 200 mg via INTRAVENOUS
  Filled 2023-09-05: qty 10

## 2023-09-05 MED ORDER — CYANOCOBALAMIN 1000 MCG/ML IJ SOLN
1000.0000 ug | Freq: Once | INTRAMUSCULAR | Status: AC
  Administered 2023-09-05: 1000 ug via INTRAMUSCULAR
  Filled 2023-09-05: qty 1

## 2023-09-05 NOTE — Addendum Note (Signed)
Addended byMichaelyn Barter on: 09/05/2023 09:10 AM   Modules accepted: Orders

## 2023-09-05 NOTE — Patient Instructions (Signed)
Iron Sucrose Injection What is this medication? IRON SUCROSE (EYE ern SOO krose) treats low levels of iron (iron deficiency anemia) in people with kidney disease. Iron is a mineral that plays an important role in making red blood cells, which carry oxygen from your lungs to the rest of your body. This medicine may be used for other purposes; ask your health care provider or pharmacist if you have questions. COMMON BRAND NAME(S): Venofer What should I tell my care team before I take this medication? They need to know if you have any of these conditions: Anemia not caused by low iron levels Heart disease High levels of iron in the blood Kidney disease Liver disease An unusual or allergic reaction to iron, other medications, foods, dyes, or preservatives Pregnant or trying to get pregnant Breastfeeding How should I use this medication? This medication is for infusion into a vein. It is given in a hospital or clinic setting. Talk to your care team about the use of this medication in children. While this medication may be prescribed for children as young as 2 years for selected conditions, precautions do apply. Overdosage: If you think you have taken too much of this medicine contact a poison control center or emergency room at once. NOTE: This medicine is only for you. Do not share this medicine with others. What if I miss a dose? Keep appointments for follow-up doses. It is important not to miss your dose. Call your care team if you are unable to keep an appointment. What may interact with this medication? Do not take this medication with any of the following: Deferoxamine Dimercaprol Other iron products This medication may also interact with the following: Chloramphenicol Deferasirox This list may not describe all possible interactions. Give your health care provider a list of all the medicines, herbs, non-prescription drugs, or dietary supplements you use. Also tell them if you smoke,  drink alcohol, or use illegal drugs. Some items may interact with your medicine. What should I watch for while using this medication? Visit your care team regularly. Tell your care team if your symptoms do not start to get better or if they get worse. You may need blood work done while you are taking this medication. You may need to follow a special diet. Talk to your care team. Foods that contain iron include: whole grains/cereals, dried fruits, beans, or peas, leafy green vegetables, and organ meats (liver, kidney). What side effects may I notice from receiving this medication? Side effects that you should report to your care team as soon as possible: Allergic reactions--skin rash, itching, hives, swelling of the face, lips, tongue, or throat Low blood pressure--dizziness, feeling faint or lightheaded, blurry vision Shortness of breath Side effects that usually do not require medical attention (report to your care team if they continue or are bothersome): Flushing Headache Joint pain Muscle pain Nausea Pain, redness, or irritation at injection site This list may not describe all possible side effects. Call your doctor for medical advice about side effects. You may report side effects to FDA at 1-800-FDA-1088. Where should I keep my medication? This medication is given in a hospital or clinic. It will not be stored at home. NOTE: This sheet is a summary. It may not cover all possible information. If you have questions about this medicine, talk to your doctor, pharmacist, or health care provider.  2024 Elsevier/Gold Standard (2023-03-11 00:00:00)

## 2023-09-12 ENCOUNTER — Inpatient Hospital Stay: Payer: BC Managed Care – PPO

## 2023-09-12 VITALS — BP 139/80 | HR 64 | Temp 96.2°F | Resp 16

## 2023-09-12 DIAGNOSIS — D508 Other iron deficiency anemias: Secondary | ICD-10-CM

## 2023-09-12 DIAGNOSIS — D509 Iron deficiency anemia, unspecified: Secondary | ICD-10-CM | POA: Diagnosis not present

## 2023-09-12 MED ORDER — IRON SUCROSE 20 MG/ML IV SOLN
200.0000 mg | Freq: Once | INTRAVENOUS | Status: AC
  Administered 2023-09-12: 200 mg via INTRAVENOUS
  Filled 2023-09-12: qty 10

## 2023-09-12 NOTE — Progress Notes (Signed)
Pt tolerated treatment without complaints.  VSS.  Pt refused 30 minute post observation period.  Pt understands risks.   

## 2023-09-12 NOTE — Patient Instructions (Signed)
Iron Sucrose Injection What is this medication? IRON SUCROSE (EYE ern SOO krose) treats low levels of iron (iron deficiency anemia) in people with kidney disease. Iron is a mineral that plays an important role in making red blood cells, which carry oxygen from your lungs to the rest of your body. This medicine may be used for other purposes; ask your health care provider or pharmacist if you have questions. COMMON BRAND NAME(S): Venofer What should I tell my care team before I take this medication? They need to know if you have any of these conditions: Anemia not caused by low iron levels Heart disease High levels of iron in the blood Kidney disease Liver disease An unusual or allergic reaction to iron, other medications, foods, dyes, or preservatives Pregnant or trying to get pregnant Breastfeeding How should I use this medication? This medication is for infusion into a vein. It is given in a hospital or clinic setting. Talk to your care team about the use of this medication in children. While this medication may be prescribed for children as young as 2 years for selected conditions, precautions do apply. Overdosage: If you think you have taken too much of this medicine contact a poison control center or emergency room at once. NOTE: This medicine is only for you. Do not share this medicine with others. What if I miss a dose? Keep appointments for follow-up doses. It is important not to miss your dose. Call your care team if you are unable to keep an appointment. What may interact with this medication? Do not take this medication with any of the following: Deferoxamine Dimercaprol Other iron products This medication may also interact with the following: Chloramphenicol Deferasirox This list may not describe all possible interactions. Give your health care provider a list of all the medicines, herbs, non-prescription drugs, or dietary supplements you use. Also tell them if you smoke,  drink alcohol, or use illegal drugs. Some items may interact with your medicine. What should I watch for while using this medication? Visit your care team regularly. Tell your care team if your symptoms do not start to get better or if they get worse. You may need blood work done while you are taking this medication. You may need to follow a special diet. Talk to your care team. Foods that contain iron include: whole grains/cereals, dried fruits, beans, or peas, leafy green vegetables, and organ meats (liver, kidney). What side effects may I notice from receiving this medication? Side effects that you should report to your care team as soon as possible: Allergic reactions--skin rash, itching, hives, swelling of the face, lips, tongue, or throat Low blood pressure--dizziness, feeling faint or lightheaded, blurry vision Shortness of breath Side effects that usually do not require medical attention (report to your care team if they continue or are bothersome): Flushing Headache Joint pain Muscle pain Nausea Pain, redness, or irritation at injection site This list may not describe all possible side effects. Call your doctor for medical advice about side effects. You may report side effects to FDA at 1-800-FDA-1088. Where should I keep my medication? This medication is given in a hospital or clinic. It will not be stored at home. NOTE: This sheet is a summary. It may not cover all possible information. If you have questions about this medicine, talk to your doctor, pharmacist, or health care provider.  2024 Elsevier/Gold Standard (2023-03-11 00:00:00)

## 2023-09-19 ENCOUNTER — Inpatient Hospital Stay: Payer: BC Managed Care – PPO

## 2023-09-26 ENCOUNTER — Inpatient Hospital Stay: Payer: BC Managed Care – PPO | Attending: Internal Medicine

## 2023-09-26 VITALS — BP 136/68 | HR 61 | Temp 96.5°F | Resp 18

## 2023-09-26 DIAGNOSIS — D509 Iron deficiency anemia, unspecified: Secondary | ICD-10-CM | POA: Insufficient documentation

## 2023-09-26 DIAGNOSIS — D508 Other iron deficiency anemias: Secondary | ICD-10-CM

## 2023-09-26 MED ORDER — IRON SUCROSE 20 MG/ML IV SOLN
200.0000 mg | Freq: Once | INTRAVENOUS | Status: AC
  Administered 2023-09-26: 200 mg via INTRAVENOUS
  Filled 2023-09-26: qty 10

## 2023-09-26 MED ORDER — SODIUM CHLORIDE 0.9% FLUSH
10.0000 mL | Freq: Once | INTRAVENOUS | Status: AC | PRN
  Administered 2023-09-26: 10 mL
  Filled 2023-09-26: qty 10

## 2023-10-03 ENCOUNTER — Inpatient Hospital Stay: Payer: BC Managed Care – PPO

## 2023-10-10 ENCOUNTER — Other Ambulatory Visit: Payer: Self-pay | Admitting: *Deleted

## 2023-10-10 ENCOUNTER — Inpatient Hospital Stay: Payer: BC Managed Care – PPO

## 2023-10-10 VITALS — BP 130/58 | HR 55

## 2023-10-10 DIAGNOSIS — D508 Other iron deficiency anemias: Secondary | ICD-10-CM

## 2023-10-10 DIAGNOSIS — D509 Iron deficiency anemia, unspecified: Secondary | ICD-10-CM | POA: Diagnosis not present

## 2023-10-10 MED ORDER — CYANOCOBALAMIN 1000 MCG/ML IJ SOLN: 1000.0000 ug | INTRAMUSCULAR | 0 refills | Status: AC

## 2023-10-10 MED ORDER — SYRINGE 25G X 1" 3 ML MISC: 1.0000 mL | 0 refills | Status: AC

## 2023-10-10 MED ORDER — SODIUM CHLORIDE 0.9% FLUSH
10.0000 mL | Freq: Once | INTRAVENOUS | Status: AC | PRN
  Administered 2023-10-10: 10 mL
  Filled 2023-10-10: qty 10

## 2023-10-10 MED ORDER — IRON SUCROSE 20 MG/ML IV SOLN
200.0000 mg | Freq: Once | INTRAVENOUS | Status: AC
  Administered 2023-10-10: 200 mg via INTRAVENOUS
  Filled 2023-10-10: qty 10

## 2023-10-10 NOTE — Progress Notes (Signed)
Confirmed with Dr. Mervyn Skeeters per patient request, moving forward she can do at home every 2 months for B12 injection, supplies sent.   Patient tolerated Venofer infusion well, no questions/concerns voiced. Refused 30 min post- monitoring. Patient stable at discharge. VSS.Refused AVS .

## 2023-10-10 NOTE — Patient Instructions (Signed)
Iron Sucrose Injection What is this medication? IRON SUCROSE (EYE ern SOO krose) treats low levels of iron (iron deficiency anemia) in people with kidney disease. Iron is a mineral that plays an important role in making red blood cells, which carry oxygen from your lungs to the rest of your body. This medicine may be used for other purposes; ask your health care provider or pharmacist if you have questions. COMMON BRAND NAME(S): Venofer What should I tell my care team before I take this medication? They need to know if you have any of these conditions: Anemia not caused by low iron levels Heart disease High levels of iron in the blood Kidney disease Liver disease An unusual or allergic reaction to iron, other medications, foods, dyes, or preservatives Pregnant or trying to get pregnant Breastfeeding How should I use this medication? This medication is for infusion into a vein. It is given in a hospital or clinic setting. Talk to your care team about the use of this medication in children. While this medication may be prescribed for children as young as 2 years for selected conditions, precautions do apply. Overdosage: If you think you have taken too much of this medicine contact a poison control center or emergency room at once. NOTE: This medicine is only for you. Do not share this medicine with others. What if I miss a dose? Keep appointments for follow-up doses. It is important not to miss your dose. Call your care team if you are unable to keep an appointment. What may interact with this medication? Do not take this medication with any of the following: Deferoxamine Dimercaprol Other iron products This medication may also interact with the following: Chloramphenicol Deferasirox This list may not describe all possible interactions. Give your health care provider a list of all the medicines, herbs, non-prescription drugs, or dietary supplements you use. Also tell them if you smoke,  drink alcohol, or use illegal drugs. Some items may interact with your medicine. What should I watch for while using this medication? Visit your care team regularly. Tell your care team if your symptoms do not start to get better or if they get worse. You may need blood work done while you are taking this medication. You may need to follow a special diet. Talk to your care team. Foods that contain iron include: whole grains/cereals, dried fruits, beans, or peas, leafy green vegetables, and organ meats (liver, kidney). What side effects may I notice from receiving this medication? Side effects that you should report to your care team as soon as possible: Allergic reactions--skin rash, itching, hives, swelling of the face, lips, tongue, or throat Low blood pressure--dizziness, feeling faint or lightheaded, blurry vision Shortness of breath Side effects that usually do not require medical attention (report to your care team if they continue or are bothersome): Flushing Headache Joint pain Muscle pain Nausea Pain, redness, or irritation at injection site This list may not describe all possible side effects. Call your doctor for medical advice about side effects. You may report side effects to FDA at 1-800-FDA-1088. Where should I keep my medication? This medication is given in a hospital or clinic. It will not be stored at home. NOTE: This sheet is a summary. It may not cover all possible information. If you have questions about this medicine, talk to your doctor, pharmacist, or health care provider.  2024 Elsevier/Gold Standard (2023-03-11 00:00:00)

## 2023-12-20 ENCOUNTER — Encounter: Payer: Self-pay | Admitting: Internal Medicine

## 2023-12-20 ENCOUNTER — Inpatient Hospital Stay: Payer: BC Managed Care – PPO | Admitting: Internal Medicine

## 2023-12-20 ENCOUNTER — Inpatient Hospital Stay: Payer: BC Managed Care – PPO

## 2023-12-20 ENCOUNTER — Inpatient Hospital Stay: Payer: BC Managed Care – PPO | Attending: Internal Medicine

## 2023-12-20 VITALS — BP 128/83 | HR 72 | Temp 97.8°F | Resp 16 | Wt 160.0 lb

## 2023-12-20 DIAGNOSIS — Z9884 Bariatric surgery status: Secondary | ICD-10-CM | POA: Diagnosis not present

## 2023-12-20 DIAGNOSIS — Z86000 Personal history of in-situ neoplasm of breast: Secondary | ICD-10-CM | POA: Diagnosis not present

## 2023-12-20 DIAGNOSIS — D508 Other iron deficiency anemias: Secondary | ICD-10-CM

## 2023-12-20 DIAGNOSIS — D649 Anemia, unspecified: Secondary | ICD-10-CM

## 2023-12-20 DIAGNOSIS — F17290 Nicotine dependence, other tobacco product, uncomplicated: Secondary | ICD-10-CM | POA: Insufficient documentation

## 2023-12-20 DIAGNOSIS — D509 Iron deficiency anemia, unspecified: Secondary | ICD-10-CM | POA: Insufficient documentation

## 2023-12-20 DIAGNOSIS — E538 Deficiency of other specified B group vitamins: Secondary | ICD-10-CM | POA: Diagnosis not present

## 2023-12-20 LAB — CMP (CANCER CENTER ONLY)
ALT: 13 U/L (ref 0–44)
AST: 19 U/L (ref 15–41)
Albumin: 4.2 g/dL (ref 3.5–5.0)
Alkaline Phosphatase: 47 U/L (ref 38–126)
Anion gap: 7 (ref 5–15)
BUN: 13 mg/dL (ref 8–23)
CO2: 27 mmol/L (ref 22–32)
Calcium: 9.2 mg/dL (ref 8.9–10.3)
Chloride: 104 mmol/L (ref 98–111)
Creatinine: 0.73 mg/dL (ref 0.44–1.00)
GFR, Estimated: >60 mL/min (ref >60)
Glucose, Bld: 97 mg/dL (ref 70–99)
Potassium: 3.9 mmol/L (ref 3.5–5.1)
Sodium: 138 mmol/L (ref 135–145)
Total Bilirubin: 0.6 mg/dL (ref 0.0–1.2)
Total Protein: 7 g/dL (ref 6.5–8.1)

## 2023-12-20 LAB — CBC WITH DIFFERENTIAL (CANCER CENTER ONLY)
Abs Immature Granulocytes: 0.02 10*3/uL (ref 0.00–0.07)
Basophils Absolute: 0.1 10*3/uL (ref 0.0–0.1)
Basophils Relative: 1 %
Eosinophils Absolute: 0.3 10*3/uL (ref 0.0–0.5)
Eosinophils Relative: 4 %
HCT: 45.5 % (ref 36.0–46.0)
Hemoglobin: 14.7 g/dL (ref 12.0–15.0)
Immature Granulocytes: 0 %
Lymphocytes Relative: 30 %
Lymphs Abs: 2.1 10*3/uL (ref 0.7–4.0)
MCH: 26.6 pg (ref 26.0–34.0)
MCHC: 32.3 g/dL (ref 30.0–36.0)
MCV: 82.3 fL (ref 80.0–100.0)
Monocytes Absolute: 0.4 10*3/uL (ref 0.1–1.0)
Monocytes Relative: 6 %
Neutro Abs: 4 10*3/uL (ref 1.7–7.7)
Neutrophils Relative %: 59 %
Platelet Count: 301 10*3/uL (ref 150–400)
RBC: 5.53 MIL/uL — ABNORMAL HIGH (ref 3.87–5.11)
RDW: 17.8 % — ABNORMAL HIGH (ref 11.5–15.5)
WBC Count: 6.9 10*3/uL (ref 4.0–10.5)
nRBC: 0 % (ref 0.0–0.2)

## 2023-12-20 LAB — IRON AND TIBC
Iron: 78 ug/dL (ref 28–170)
Saturation Ratios: 14 % (ref 10.4–31.8)
TIBC: 553 ug/dL — ABNORMAL HIGH (ref 250–450)
UIBC: 475 ug/dL

## 2023-12-20 LAB — FERRITIN: Ferritin: 5 ng/mL — ABNORMAL LOW (ref 11–307)

## 2023-12-20 LAB — VITAMIN B12: Vitamin B-12: 201 pg/mL (ref 180–914)

## 2023-12-20 NOTE — Progress Notes (Signed)
 [facility]  [phone]) [phone] [phone]) [phone]  ID: Erin Tate OB: January 16, 1961  MR#: [id number]  AOZ#:[id number]  Patient Care Team: Alan Mulder, MD as PCP - General (Radiology) Lemar Livings Merrily Pew, MD (General Surgery) Patient, No Pcp Per (General Practice) Michaelyn Barter, MD as Consulting Physician (Oncology)  Reason for visit-iron deficiency anemia, vitamin B12 deficiency  HPI:  Erin Tate is a 63 y.o. female with past medical history of left breast DCIS status postmastectomy 1996, IDA, gastric bypass surgery, history of GI bleed, gastric ulcer, diabetes, hyperlipidemia, anxiety referred to hematology for iron deficiency anemia management.  Patient reports worsening fatigue over past 6 months.  She has stopped work schedule 70 hours a week.  Having headaches.  Denies any bleeding in urine or stool.  She was previously seen by Dr. Donneta Romberg and received IV Feraheme in 2017.  History of gastric bypass surgery.  History of GI bleed from gastric ulcer.  Her last colonoscopy was in 2016 which was unremarkable.  Repeat was recommended in 10 years. Has previously taken iron pills causes severe constipation.  Labs from October 2024 hemoglobin of 11.6, ferritin 5 and saturation 6%.  Folate 6.5 and B12 220.  She is on B12 supplements 1000 mcg daily.  Completed IV Venofer in December 2024.  Interval history Patient seen today as follow-up for iron deficiency anemia and labs. Reports feeling better after iron infusions.  Has not started taking B12 injections yet.  She received a call from GI but has not been able to schedule due to the change in the job and the change in the insurance.  She is planning to call them  REVIEW OF SYSTEMS:   ROS  As per HPI. Otherwise, a complete review of systems is negative.  PAST MEDICAL HISTORY: Past Medical History:  Diagnosis Date   Asthma    Breast cancer (HCC) 1996   LEFT MASTECTOMY DCIS    Diabetes mellitus without complication (HCC)    Headache    Heart murmur    High cholesterol    Hypothyroidism    IDA (iron deficiency anemia)    Osteoarthritis    h/o Reclast injection   Pneumonia    Thyroid condition 1991   Upper GI bleed 2014    PAST SURGICAL HISTORY: Past Surgical History:  Procedure Laterality Date   ABDOMINAL HYSTERECTOMY  1997   BREAST BIOPSY Right 2003   neg   CARPAL TUNNEL RELEASE Bilateral [id number]   COLONOSCOPY  2006   Dr. Lemar Livings, hyperplastic polyp of the rectum. Diverticulosis.   COLONOSCOPY WITH PROPOFOL N/A 10/08/2015   Procedure: COLONOSCOPY WITH PROPOFOL;  Surgeon: [name], MD;  Location: [facility] ENDOSCOPY;  Service: Endoscopy;  Laterality: N/A;   ESOPHAGOGASTRODUODENOSCOPY (EGD) WITH PROPOFOL N/A 10/08/2015   Procedure: ESOPHAGOGASTRODUODENOSCOPY (EGD) WITH PROPOFOL;  Surgeon: [name], MD;  Location: [facility] ENDOSCOPY;  Service: Endoscopy;  Laterality: N/A;   GASTRIC BYPASS  2010   HEMORRHOID SURGERY  1983   KNEE SURGERY Right 2011   MASTECTOMY Left 1996   DCIS   NECK SURGERY  04/2013   OOPHORECTOMY     subclavical osteoarthritis removal     TONSILLECTOMY  1984   UPPER GI ENDOSCOPY  2010, 2014, 2015, 2016    FAMILY HISTORY: Family History  Problem Relation Age of Onset   Lung cancer Mother    Hodgkin's lymphoma Father    Breast cancer Paternal Aunt     HEALTH MAINTENANCE: Social History   Tobacco Use  Smoking status: Some Days    Current packs/day: 1.00    Average packs/day: 1 pack/day for 15.0 years (15.0 ttl pk-yrs)    Types: E-cigarettes, Cigarettes   Smokeless tobacco: Never  Substance Use Topics   Alcohol use: No   Drug use: No     No Known Allergies  Current Outpatient Medications  Medication Sig Dispense Refill   cyanocobalamin (VITAMIN B12) 1000 MCG/ML injection Inject 1 mL (1,000 mcg total) into the muscle every 2 (two) months. 3 mL 0   ergocalciferol (VITAMIN D2) 50000 UNITS capsule Take  50,000 Units by mouth every Friday.      levothyroxine (SYNTHROID, LEVOTHROID) 125 MCG tablet Take 125 mcg by mouth daily.      Multiple Vitamin (MULTI-VITAMIN) tablet Take by mouth.     omeprazole (PRILOSEC) 40 MG capsule Take 40 mg by mouth daily.     ondansetron (ZOFRAN) 4 MG tablet Take 1 tablet (4 mg total) by mouth every 8 (eight) hours as needed for nausea or vomiting. 8 tablet 0   Syringe/Needle, Disp, (SYRINGE 3CC/25GX1") 25G X 1" 3 ML MISC Inject 1 mL into the muscle every 2 (two) months. 3 each 0   tirzepatide (MOUNJARO) 15 MG/0.5ML Pen See admin instructions.     varenicline (CHANTIX) 1 MG tablet Take 1 mg by mouth 2 (two) times daily.     No current facility-administered medications for this visit.    OBJECTIVE: Vitals:   12/20/23 1033  BP: 128/83  Pulse: 72  Resp: 16  Temp: 97.8 F (36.6 C)  SpO2: 100%     Body mass index is 25.06 kg/m.      General: Well-developed, well-nourished, no acute distress. Eyes: Pink conjunctiva, anicteric sclera. HEENT: Normocephalic, moist mucous membranes, clear oropharnyx. Lungs: Clear to auscultation bilaterally. Heart: Regular rate and rhythm. No rubs, murmurs, or gallops. Abdomen: Soft, nontender, nondistended. No organomegaly noted, normoactive bowel sounds. Musculoskeletal: No edema, cyanosis, or clubbing. Neuro: Alert, answering all questions appropriately. Cranial nerves grossly intact. Skin: No rashes or petechiae noted. Psych: Normal affect. Lymphatics: No cervical, calvicular, axillary or inguinal LAD.   LAB RESULTS:  Lab Results  Component Value Date   NA 138 12/20/2023   K 3.9 12/20/2023   CL 104 12/20/2023   CO2 27 12/20/2023   GLUCOSE 97 12/20/2023   BUN 13 12/20/2023   CREATININE 0.73 12/20/2023   CALCIUM 9.2 12/20/2023   PROT 7.0 12/20/2023   ALBUMIN 4.2 12/20/2023   AST 19 12/20/2023   ALT 13 12/20/2023   ALKPHOS 47 12/20/2023   BILITOT 0.6 12/20/2023   GFRNONAA >60 12/20/2023   GFRAA >60 12/30/2017     Lab Results  Component Value Date   WBC 6.9 12/20/2023   NEUTROABS 4.0 12/20/2023   HGB 14.7 12/20/2023   HCT 45.5 12/20/2023   MCV 82.3 12/20/2023   PLT 301 12/20/2023    Lab Results  Component Value Date   TIBC 482 (H) 09/06/2016   TIBC 483 (H) 05/28/2016   TIBC 595 (H) 02/02/2016   FERRITIN 8 (L) 09/06/2016   FERRITIN 5 (L) 05/28/2016   FERRITIN 2 (L) 02/02/2016   IRONPCTSAT 24 09/06/2016   IRONPCTSAT 6 (L) 05/28/2016   IRONPCTSAT 1 (L) 02/02/2016     STUDIES: No results found.  ASSESSMENT AND PLAN:   Erin Tate is a 62 y.o. female with pmh of  left breast DCIS status postmastectomy 1996, IDA, gastric bypass surgery, history of GI bleed, gastric ulcer, diabetes, hyperlipidemia, anxiety referred to  hematology for iron deficiency anemia management.  # Iron deficiency anemia # History of gastric bypass surgery in 2010 -Differential includes malabsorption from gastric bypass.  Cannot rule out occult bleed.  Has previously taken iron pills causes severe constipation. She was previously seen by Dr. Donneta Romberg and received IV Feraheme in 2017. History of GI bleed from gastric ulcer.  -Completed IV Venofer in December 2024.  Insurance did not cover Feraheme.  Hemoglobin has improved from 11.6-14.7.  Iron levels are pending.  Will hold iron fusions today.    -last colonoscopy was in 2016 which was unremarkable.  Referral to GI was placed last visit.  The office reached out to her but she could not schedule with the change in the job and the change in the insurance.  I reiterated the importance of getting colonoscopy and endoscopy to make sure there is no component of occult GI bleed.  She plans to call their office and schedule.  # Borderline low B12 level -B12 from October 2024 220.  Continue with B12 supplement 1000 mcg daily -Since her B12 was less than 300, we discussed about doing B12 injections every 2 months.  She was comfortable doing it at home but has not  started yet.  B12 levels pending from today.  Will reach out if the levels are still low.  Orders Placed This Encounter  Procedures   CBC with Differential (Cancer Center Only)   Ferritin   Iron and TIBC([facility])   Vitamin B12   RTC in 6 months for MD visit, labs, possible Venofer  Patient expressed understanding and was in agreement with this plan. She also understands that She can call clinic at any time with any questions, concerns, or complaints.   I spent a total of 25 minutes reviewing chart data, face-to-face evaluation with the patient, counseling and coordination of care as detailed above.  Michaelyn Barter, MD   12/20/2023 10:56 AM

## 2023-12-22 ENCOUNTER — Encounter: Payer: Self-pay | Admitting: *Deleted

## 2024-01-24 ENCOUNTER — Inpatient Hospital Stay

## 2024-01-31 ENCOUNTER — Inpatient Hospital Stay: Attending: Internal Medicine

## 2024-01-31 VITALS — BP 121/75 | HR 74 | Temp 97.3°F | Resp 18

## 2024-01-31 DIAGNOSIS — D509 Iron deficiency anemia, unspecified: Secondary | ICD-10-CM | POA: Insufficient documentation

## 2024-01-31 DIAGNOSIS — D508 Other iron deficiency anemias: Secondary | ICD-10-CM

## 2024-01-31 MED ORDER — IRON SUCROSE 20 MG/ML IV SOLN
200.0000 mg | Freq: Once | INTRAVENOUS | Status: AC
  Administered 2024-01-31: 200 mg via INTRAVENOUS

## 2024-02-08 ENCOUNTER — Inpatient Hospital Stay

## 2024-02-08 VITALS — BP 128/75 | HR 66 | Temp 96.1°F | Resp 18

## 2024-02-08 DIAGNOSIS — D509 Iron deficiency anemia, unspecified: Secondary | ICD-10-CM | POA: Diagnosis not present

## 2024-02-08 DIAGNOSIS — D508 Other iron deficiency anemias: Secondary | ICD-10-CM

## 2024-02-08 MED ORDER — SODIUM CHLORIDE 0.9% FLUSH
10.0000 mL | Freq: Once | INTRAVENOUS | Status: AC | PRN
Start: 2024-02-08 — End: 2024-02-08
  Administered 2024-02-08: 10 mL
  Filled 2024-02-08: qty 10

## 2024-02-08 MED ORDER — IRON SUCROSE 20 MG/ML IV SOLN
200.0000 mg | Freq: Once | INTRAVENOUS | Status: AC
  Administered 2024-02-08: 200 mg via INTRAVENOUS

## 2024-02-28 ENCOUNTER — Encounter: Payer: Self-pay | Admitting: Nurse Practitioner

## 2024-05-29 ENCOUNTER — Other Ambulatory Visit: Payer: Self-pay | Admitting: Medical Genetics

## 2024-06-25 ENCOUNTER — Telehealth: Payer: Self-pay | Admitting: Internal Medicine

## 2024-06-25 NOTE — Telephone Encounter (Signed)
 Pt needed to r/s appts due to work schedule and [employer].   Labs are r/s to 9/24 due to work schedule.  Pt stated the insurance company had to see lab results and MD/infusion needed to be scheduled 3-4 weeks after labs.   MD will not be here on this new date and pt was okay with seeing NP. New appts are scheduled for 10/27.

## 2024-06-26 ENCOUNTER — Ambulatory Visit

## 2024-06-26 ENCOUNTER — Ambulatory Visit: Admitting: Internal Medicine

## 2024-06-26 ENCOUNTER — Inpatient Hospital Stay

## 2024-06-26 ENCOUNTER — Other Ambulatory Visit

## 2024-07-11 ENCOUNTER — Other Ambulatory Visit: Payer: Self-pay

## 2024-07-11 ENCOUNTER — Inpatient Hospital Stay: Attending: Internal Medicine

## 2024-07-11 DIAGNOSIS — D508 Other iron deficiency anemias: Secondary | ICD-10-CM

## 2024-07-11 DIAGNOSIS — D509 Iron deficiency anemia, unspecified: Secondary | ICD-10-CM | POA: Insufficient documentation

## 2024-07-11 DIAGNOSIS — E538 Deficiency of other specified B group vitamins: Secondary | ICD-10-CM | POA: Diagnosis present

## 2024-07-11 LAB — CBC WITH DIFFERENTIAL (CANCER CENTER ONLY)
Abs Immature Granulocytes: 0.02 K/uL (ref 0.00–0.07)
Basophils Absolute: 0.1 K/uL (ref 0.0–0.1)
Basophils Relative: 2 %
Eosinophils Absolute: 0.1 K/uL (ref 0.0–0.5)
Eosinophils Relative: 3 %
HCT: 43.1 % (ref 36.0–46.0)
Hemoglobin: 14.6 g/dL (ref 12.0–15.0)
Immature Granulocytes: 0 %
Lymphocytes Relative: 30 %
Lymphs Abs: 1.6 K/uL (ref 0.7–4.0)
MCH: 29.5 pg (ref 26.0–34.0)
MCHC: 33.9 g/dL (ref 30.0–36.0)
MCV: 87.1 fL (ref 80.0–100.0)
Monocytes Absolute: 0.4 K/uL (ref 0.1–1.0)
Monocytes Relative: 7 %
Neutro Abs: 3.1 K/uL (ref 1.7–7.7)
Neutrophils Relative %: 58 %
Platelet Count: 281 K/uL (ref 150–400)
RBC: 4.95 MIL/uL (ref 3.87–5.11)
RDW: 13.4 % (ref 11.5–15.5)
WBC Count: 5.3 K/uL (ref 4.0–10.5)
nRBC: 0 % (ref 0.0–0.2)

## 2024-07-11 LAB — IRON AND TIBC
Iron: 98 ug/dL (ref 28–170)
Saturation Ratios: 25 % (ref 10.4–31.8)
TIBC: 389 ug/dL (ref 250–450)
UIBC: 291 ug/dL

## 2024-07-11 LAB — VITAMIN B12: Vitamin B-12: 434 pg/mL (ref 180–914)

## 2024-07-11 LAB — FERRITIN: Ferritin: 25 ng/mL (ref 11–307)

## 2024-07-17 ENCOUNTER — Other Ambulatory Visit

## 2024-07-17 ENCOUNTER — Ambulatory Visit: Admitting: Internal Medicine

## 2024-07-17 ENCOUNTER — Ambulatory Visit

## 2024-08-10 ENCOUNTER — Other Ambulatory Visit: Payer: Self-pay | Admitting: *Deleted

## 2024-08-10 ENCOUNTER — Other Ambulatory Visit: Payer: Self-pay | Admitting: Medical Genetics

## 2024-08-10 DIAGNOSIS — Z006 Encounter for examination for normal comparison and control in clinical research program: Secondary | ICD-10-CM

## 2024-08-10 DIAGNOSIS — E538 Deficiency of other specified B group vitamins: Secondary | ICD-10-CM

## 2024-08-10 DIAGNOSIS — D508 Other iron deficiency anemias: Secondary | ICD-10-CM

## 2024-08-13 ENCOUNTER — Encounter: Payer: Self-pay | Admitting: Nurse Practitioner

## 2024-08-13 ENCOUNTER — Inpatient Hospital Stay: Attending: Internal Medicine | Admitting: Nurse Practitioner

## 2024-08-13 ENCOUNTER — Inpatient Hospital Stay

## 2024-08-13 VITALS — BP 140/84 | HR 66 | Temp 98.5°F | Resp 16 | Ht 67.0 in | Wt 136.2 lb

## 2024-08-13 DIAGNOSIS — E538 Deficiency of other specified B group vitamins: Secondary | ICD-10-CM | POA: Diagnosis present

## 2024-08-13 DIAGNOSIS — E119 Type 2 diabetes mellitus without complications: Secondary | ICD-10-CM | POA: Insufficient documentation

## 2024-08-13 DIAGNOSIS — D508 Other iron deficiency anemias: Secondary | ICD-10-CM | POA: Diagnosis not present

## 2024-08-13 DIAGNOSIS — D509 Iron deficiency anemia, unspecified: Secondary | ICD-10-CM | POA: Diagnosis present

## 2024-08-13 DIAGNOSIS — Z86000 Personal history of in-situ neoplasm of breast: Secondary | ICD-10-CM | POA: Diagnosis not present

## 2024-08-13 NOTE — Progress Notes (Signed)
 Sinus infection x2 weeks, finished tetracycline yesterday. Not feeling any better.

## 2024-08-13 NOTE — Progress Notes (Signed)
 " [facility]  [phone] [phone]  ID: Erin Tate OB: [birth year]  MR#: [id number]  RDW#:[id number]  Patient Care Team: Christi Vannie PARAS, MD as PCP - General (Radiology) Dessa Reyes ORN, MD (General Surgery) Patient, No Pcp Per (General Practice) Rennie Cindy SAUNDERS, MD as Consulting Physician (Oncology)  Reason for visit- iron  deficiency anemia, vitamin B12 deficiency  HPI: Erin Tate presented as a 63 year old female with PMH of left breast DCIS status postmastectomy 1996, IDA, gastric bypass surgery, history of GI bleed, gastric ulcer, diabetes, hyperlipidemia, anxiety referred to hematology for iron  deficiency anemia management.  Patient reports worsening fatigue over past 6 months.  She has stopped work schedule 70 hours a week.  Having headaches.  Denies any bleeding in urine or stool.  She was previously seen by Dr. Rennie and received IV Feraheme  in 2017.  History of gastric bypass surgery.  History of GI bleed from gastric ulcer.  Her last colonoscopy was in 2016 which was unremarkable.  Repeat was recommended in 10 years. Has previously taken iron  pills causes severe constipation.  Labs from October 2024 hemoglobin of 11.6, ferritin 5 and saturation 6%.  Folate 6.5 and B12 220.  She is on B12 supplements 1000 mcg daily.  Completed IV Venofer  in December 2024.  Interval history: Erin Tate is a 63 y.o. who returns to clinic for follow-up of iron  deficiency anemia and B12 deficiency.  She last received IV Venofer  in April 2025.  Last received B12 injection in November 2024.  She has not seen GI in the interim.   REVIEW OF SYSTEMS:   Review of Systems  Constitutional:  Negative for chills, fever, malaise/fatigue and weight loss.  HENT:  Negative for hearing loss, nosebleeds, sore throat and tinnitus.   Eyes:  Negative for blurred vision and double vision.  Respiratory:  Negative for cough,  hemoptysis, shortness of breath and wheezing.   Cardiovascular:  Negative for chest pain, palpitations and leg swelling.  Gastrointestinal:  Negative for abdominal pain, blood in stool, constipation, diarrhea, melena, nausea and vomiting.  Genitourinary:  Negative for dysuria and urgency.  Musculoskeletal:  Negative for back pain, falls, joint pain and myalgias.  Skin:  Negative for itching and rash.  Neurological:  Negative for dizziness, tingling, sensory change, loss of consciousness, weakness and headaches.  Endo/Heme/Allergies:  Negative for environmental allergies. Does not bruise/bleed easily.  Psychiatric/Behavioral:  Negative for depression. The patient is not nervous/anxious and does not have insomnia.     PAST MEDICAL HISTORY: Past Medical History:  Diagnosis Date   Asthma    Breast cancer (HCC) 1996   LEFT MASTECTOMY DCIS   Diabetes mellitus without complication (HCC)    Headache    Heart murmur    High cholesterol    Hypothyroidism    IDA (iron  deficiency anemia)    Osteoarthritis    h/o Reclast injection   Pneumonia    Thyroid condition 1991   Upper GI bleed 2014    PAST SURGICAL HISTORY: Past Surgical History:  Procedure Laterality Date   ABDOMINAL HYSTERECTOMY  1997   BREAST BIOPSY Right 2003   neg   CARPAL TUNNEL RELEASE Bilateral [id number]   COLONOSCOPY  2006   Dr. Dessa, hyperplastic polyp of the rectum. Diverticulosis.   COLONOSCOPY WITH PROPOFOL  N/A 10/08/2015   Procedure: COLONOSCOPY WITH PROPOFOL ;  Surgeon: Reyes ORN Dessa, MD;  Location[facility] END[facility]COPY;  Service: Endoscopy;  Laterality: N/A;   ESOPHAGOGASTRODUODENOSCOPY (EGD) WITH PROPOFOL  N/A  10/08/2015   Procedure: ESOPHAGOGASTRODUODENOSCOPY (EGD) WITH PROPOFOL ;  Surgeon: Reyes LELON Cota, MD;  Location: [facility] ENDOSCOPY;  Service: Endoscopy;  Laterality: N/A;   GASTRIC BYPASS  2010   HEMORRHOID SURGERY  1983   KNEE SURGERY Right 2011   MASTECTOMY Left 1996   DCIS   NECK SURGERY  04/2013    OOPHORECTOMY     subclavical osteoarthritis removal     TONSILLECTOMY  1984   UPPER GI ENDOSCOPY  2010, 2014, 2015, 2016    FAMILY HISTORY: Family History  Problem Relation Age of Onset   Lung cancer Mother    Hodgkin's lymphoma Father    Breast cancer Paternal Aunt     HEALTH MAINTENANCE: Social History   Tobacco Use   Smoking status: Some Days    Current packs/day: 1.00    Average packs/day: 1 pack/day for 15.0 years (15.0 ttl pk-yrs)    Types: E-cigarettes, Cigarettes   Smokeless tobacco: Never  Substance Use Topics   Alcohol use: No   Drug use: No   No Known Allergies  Current Outpatient Medications  Medication Sig Dispense Refill   cyanocobalamin  (VITAMIN B12) 1000 MCG/ML injection Inject 1 mL (1,000 mcg total) into the muscle every 2 (two) months. 3 mL 0   ergocalciferol (VITAMIN D2) 50000 UNITS capsule Take 50,000 Units by mouth every Friday.      levothyroxine (SYNTHROID, LEVOTHROID) 125 MCG tablet Take 125 mcg by mouth daily.      Multiple Vitamin (MULTI-VITAMIN) tablet Take by mouth.     omeprazole (PRILOSEC) 40 MG capsule Take 40 mg by mouth daily. (Patient taking differently: Take 40 mg by mouth daily as needed.)     ondansetron  (ZOFRAN ) 4 MG tablet Take 1 tablet (4 mg total) by mouth every 8 (eight) hours as needed for nausea or vomiting. 8 tablet 0   Syringe/Needle, Disp, (SYRINGE 3CC/25GX1) 25G X 1 3 ML MISC Inject 1 mL into the muscle every 2 (two) months. 3 each 0   tirzepatide (MOUNJARO) 15 MG/0.5ML Pen See admin instructions.     varenicline (CHANTIX) 1 MG tablet Take 1 mg by mouth 2 (two) times daily.     No current facility-administered medications for this visit.    OBJECTIVE: Vitals:   08/13/24 1055  BP: (!) 140/84  Pulse: 66  Resp: 16  Temp: 98.5 F (36.9 C)  SpO2: 100%     Body mass index is 21.33 kg/m.      General: Well-developed, well-nourished, no acute distress. Eyes: Pink conjunctiva, anicteric sclera. Lungs: Clear to  auscultation bilaterally.  No audible wheezing or coughing Heart: Regular rate and rhythm.  Abdomen: Soft, nontender, nondistended.  Musculoskeletal: No edema, cyanosis, or clubbing. Neuro: Alert, answering all questions appropriately. Cranial nerves grossly intact. Skin: No rashes or petechiae noted. Psych: Normal affect.   LAB RESULTS: Lab Results  Component Value Date   NA 138 12/20/2023   K 3.9 12/20/2023   CL 104 12/20/2023   CO2 27 12/20/2023   GLUCOSE 97 12/20/2023   BUN 13 12/20/2023   CREATININE 0.73 12/20/2023   CALCIUM 9.2 12/20/2023   PROT 7.0 12/20/2023   ALBUMIN 4.2 12/20/2023   AST 19 12/20/2023   ALT 13 12/20/2023   ALKPHOS 47 12/20/2023   BILITOT 0.6 12/20/2023   GFRNONAA >60 12/20/2023   GFRAA >60 12/30/2017   Lab Results  Component Value Date   WBC 5.3 07/11/2024   NEUTROABS 3.1 07/11/2024   HGB 14.6 07/11/2024   HCT 43.1 07/11/2024  MCV 87.1 07/11/2024   PLT 281 07/11/2024   Lab Results  Component Value Date   TIBC 389 07/11/2024   TIBC 553 (H) 12/20/2023   TIBC 482 (H) 09/06/2016   FERRITIN 25 07/11/2024   FERRITIN 5 (L) 12/20/2023   FERRITIN 8 (L) 09/06/2016   IRONPCTSAT 25 07/11/2024   IRONPCTSAT 14 12/20/2023   IRONPCTSAT 24 09/06/2016   STUDIES: No results found.  ASSESSMENT AND PLAN: Erin Tate is a 63 y.o. female who returns to clinic for follow-up of:   # Iron  deficiency anemia-she previously received IV Feraheme  in 2017.  Feraheme  no longer covered by insurance.  She has received Venofer , last in April 2025.  Tolerated well. Reviewing labs from 07/11/2024- hemoglobin is 14.6 without evidence of microcytosis, ferritin has improved to 25, iron  saturation is 25%. Hold IV iron  today.   # Etiology of iron  deficiency-history of gastric bypass in 2010.  Etiology thought to include malabsorption from gastric bypass however, cannot rule out an occult GI bleed and she does have a history of gastric ulcer.  Her last colonoscopy was  in 2016 and was unremarkable.  She has not yet been seen by GI due to cost/insurance.   # Borderline low B12 level-B12 from October 2024 was 220.  She takes oral B12 supplement 1000 mcg daily.  She received 1 dose of B12 injection.  Tolerated well.  Her most recent b12 level from September was 434.  We discussed that with gastric bypass there may be a role for intermittent B12 injections that she is tolerating oral B12 well and her level is normal.  I recommend that she continue oral B12 and we will plan for B12 injection if her level drops below 300.  # Left breast DCIS-status postmastectomy 1996.  She is followed by surgery for her annual mammograms and her last was due in July 2025.  I encouraged her to reach out to surgery to get scheduled.  Disposition: 6 months-labs, Dr. Rennie, +/- venofer  &/or B12- la  No orders of the defined types were placed in this encounter.  Patient expressed understanding and was in agreement with this plan. She also understands that She can call clinic at any time with any questions, concerns, or complaints.   I spent a total of 20 minutes reviewing chart data, face-to-face evaluation with the patient, counseling and coordination of care as detailed above.  Tinnie KANDICE Dawn, NP    08/13/2024     "

## 2024-10-15 ENCOUNTER — Encounter: Payer: Self-pay | Admitting: Internal Medicine

## 2025-02-11 ENCOUNTER — Inpatient Hospital Stay

## 2025-02-11 ENCOUNTER — Inpatient Hospital Stay: Admitting: Internal Medicine
